# Patient Record
Sex: Female | Born: 1975 | Race: Black or African American | Hispanic: No | Marital: Single | Smoking: Current every day smoker
Health system: Southern US, Community
[De-identification: ages and names within clinical notes are randomized; demographics above are authoritative.]

## PROBLEM LIST (undated history)

## (undated) DIAGNOSIS — M1990 Unspecified osteoarthritis, unspecified site: Secondary | ICD-10-CM

## (undated) DIAGNOSIS — M549 Dorsalgia, unspecified: Secondary | ICD-10-CM

## (undated) DIAGNOSIS — I10 Essential (primary) hypertension: Secondary | ICD-10-CM

## (undated) DIAGNOSIS — M25559 Pain in unspecified hip: Secondary | ICD-10-CM

## (undated) DIAGNOSIS — J45909 Unspecified asthma, uncomplicated: Secondary | ICD-10-CM

## (undated) DIAGNOSIS — G8929 Other chronic pain: Secondary | ICD-10-CM

## (undated) HISTORY — PX: TUBAL LIGATION: SHX77

## (undated) HISTORY — PX: TONSILLECTOMY: SUR1361

---

## 2002-07-28 ENCOUNTER — Encounter: Payer: Self-pay | Admitting: Emergency Medicine

## 2002-07-28 ENCOUNTER — Emergency Department (HOSPITAL_COMMUNITY): Admission: EM | Admit: 2002-07-28 | Discharge: 2002-07-28 | Payer: Self-pay | Admitting: Emergency Medicine

## 2003-01-28 ENCOUNTER — Emergency Department (HOSPITAL_COMMUNITY): Admission: EM | Admit: 2003-01-28 | Discharge: 2003-01-28 | Payer: Self-pay | Admitting: *Deleted

## 2003-01-28 ENCOUNTER — Encounter: Payer: Self-pay | Admitting: *Deleted

## 2003-02-15 ENCOUNTER — Emergency Department (HOSPITAL_COMMUNITY): Admission: EM | Admit: 2003-02-15 | Discharge: 2003-02-15 | Payer: Self-pay | Admitting: *Deleted

## 2003-02-15 ENCOUNTER — Encounter: Payer: Self-pay | Admitting: *Deleted

## 2003-02-19 ENCOUNTER — Emergency Department (HOSPITAL_COMMUNITY): Admission: EM | Admit: 2003-02-19 | Discharge: 2003-02-20 | Payer: Self-pay | Admitting: *Deleted

## 2003-04-08 ENCOUNTER — Emergency Department (HOSPITAL_COMMUNITY): Admission: EM | Admit: 2003-04-08 | Discharge: 2003-04-08 | Payer: Self-pay | Admitting: Emergency Medicine

## 2003-04-14 ENCOUNTER — Emergency Department (HOSPITAL_COMMUNITY): Admission: EM | Admit: 2003-04-14 | Discharge: 2003-04-14 | Payer: Self-pay | Admitting: Emergency Medicine

## 2003-04-23 ENCOUNTER — Emergency Department (HOSPITAL_COMMUNITY): Admission: EM | Admit: 2003-04-23 | Discharge: 2003-04-23 | Payer: Self-pay | Admitting: *Deleted

## 2003-06-21 ENCOUNTER — Emergency Department (HOSPITAL_COMMUNITY): Admission: EM | Admit: 2003-06-21 | Discharge: 2003-06-21 | Payer: Self-pay | Admitting: Emergency Medicine

## 2003-06-25 ENCOUNTER — Ambulatory Visit (HOSPITAL_COMMUNITY): Admission: RE | Admit: 2003-06-25 | Discharge: 2003-06-25 | Payer: Self-pay | Admitting: Family Medicine

## 2003-09-14 ENCOUNTER — Emergency Department (HOSPITAL_COMMUNITY): Admission: EM | Admit: 2003-09-14 | Discharge: 2003-09-14 | Payer: Self-pay | Admitting: Emergency Medicine

## 2003-09-16 ENCOUNTER — Emergency Department (HOSPITAL_COMMUNITY): Admission: EM | Admit: 2003-09-16 | Discharge: 2003-09-16 | Payer: Self-pay | Admitting: Emergency Medicine

## 2003-10-04 ENCOUNTER — Emergency Department (HOSPITAL_COMMUNITY): Admission: EM | Admit: 2003-10-04 | Discharge: 2003-10-04 | Payer: Self-pay | Admitting: Emergency Medicine

## 2003-11-27 ENCOUNTER — Emergency Department (HOSPITAL_COMMUNITY): Admission: EM | Admit: 2003-11-27 | Discharge: 2003-11-27 | Payer: Self-pay | Admitting: Emergency Medicine

## 2004-01-26 ENCOUNTER — Emergency Department (HOSPITAL_COMMUNITY): Admission: EM | Admit: 2004-01-26 | Discharge: 2004-01-27 | Payer: Self-pay | Admitting: Emergency Medicine

## 2004-03-17 ENCOUNTER — Emergency Department (HOSPITAL_COMMUNITY): Admission: EM | Admit: 2004-03-17 | Discharge: 2004-03-17 | Payer: Self-pay | Admitting: Emergency Medicine

## 2004-06-28 ENCOUNTER — Emergency Department (HOSPITAL_COMMUNITY): Admission: EM | Admit: 2004-06-28 | Discharge: 2004-06-29 | Payer: Self-pay | Admitting: *Deleted

## 2004-06-29 ENCOUNTER — Ambulatory Visit (HOSPITAL_COMMUNITY): Admission: RE | Admit: 2004-06-29 | Discharge: 2004-06-29 | Payer: Self-pay | Admitting: *Deleted

## 2004-07-03 ENCOUNTER — Emergency Department (HOSPITAL_COMMUNITY): Admission: EM | Admit: 2004-07-03 | Discharge: 2004-07-03 | Payer: Self-pay | Admitting: Emergency Medicine

## 2004-07-09 ENCOUNTER — Emergency Department (HOSPITAL_COMMUNITY): Admission: EM | Admit: 2004-07-09 | Discharge: 2004-07-10 | Payer: Self-pay | Admitting: *Deleted

## 2004-08-27 ENCOUNTER — Emergency Department (HOSPITAL_COMMUNITY): Admission: EM | Admit: 2004-08-27 | Discharge: 2004-08-27 | Payer: Self-pay | Admitting: Emergency Medicine

## 2004-10-09 ENCOUNTER — Emergency Department (HOSPITAL_COMMUNITY): Admission: EM | Admit: 2004-10-09 | Discharge: 2004-10-09 | Payer: Self-pay | Admitting: Emergency Medicine

## 2005-01-11 ENCOUNTER — Emergency Department (HOSPITAL_COMMUNITY): Admission: EM | Admit: 2005-01-11 | Discharge: 2005-01-11 | Payer: Self-pay | Admitting: Emergency Medicine

## 2005-01-30 ENCOUNTER — Emergency Department (HOSPITAL_COMMUNITY): Admission: EM | Admit: 2005-01-30 | Discharge: 2005-01-30 | Payer: Self-pay | Admitting: Emergency Medicine

## 2005-03-13 ENCOUNTER — Emergency Department (HOSPITAL_COMMUNITY): Admission: EM | Admit: 2005-03-13 | Discharge: 2005-03-14 | Payer: Self-pay | Admitting: *Deleted

## 2005-05-24 ENCOUNTER — Emergency Department (HOSPITAL_COMMUNITY): Admission: EM | Admit: 2005-05-24 | Discharge: 2005-05-24 | Payer: Self-pay | Admitting: Emergency Medicine

## 2005-06-05 ENCOUNTER — Emergency Department (HOSPITAL_COMMUNITY): Admission: EM | Admit: 2005-06-05 | Discharge: 2005-06-05 | Payer: Self-pay | Admitting: Emergency Medicine

## 2005-06-28 IMAGING — CR DG CHEST 2V
2 series · 2 of 2 positions shown · non-contrast
Comparison: 01/30/05.

CLINICAL DATA: Pain under brain, cough. 
 PA AND LATERAL CHEST ? 2 VIEW, 03/14/05:

[view not recorded (1 of 2)]
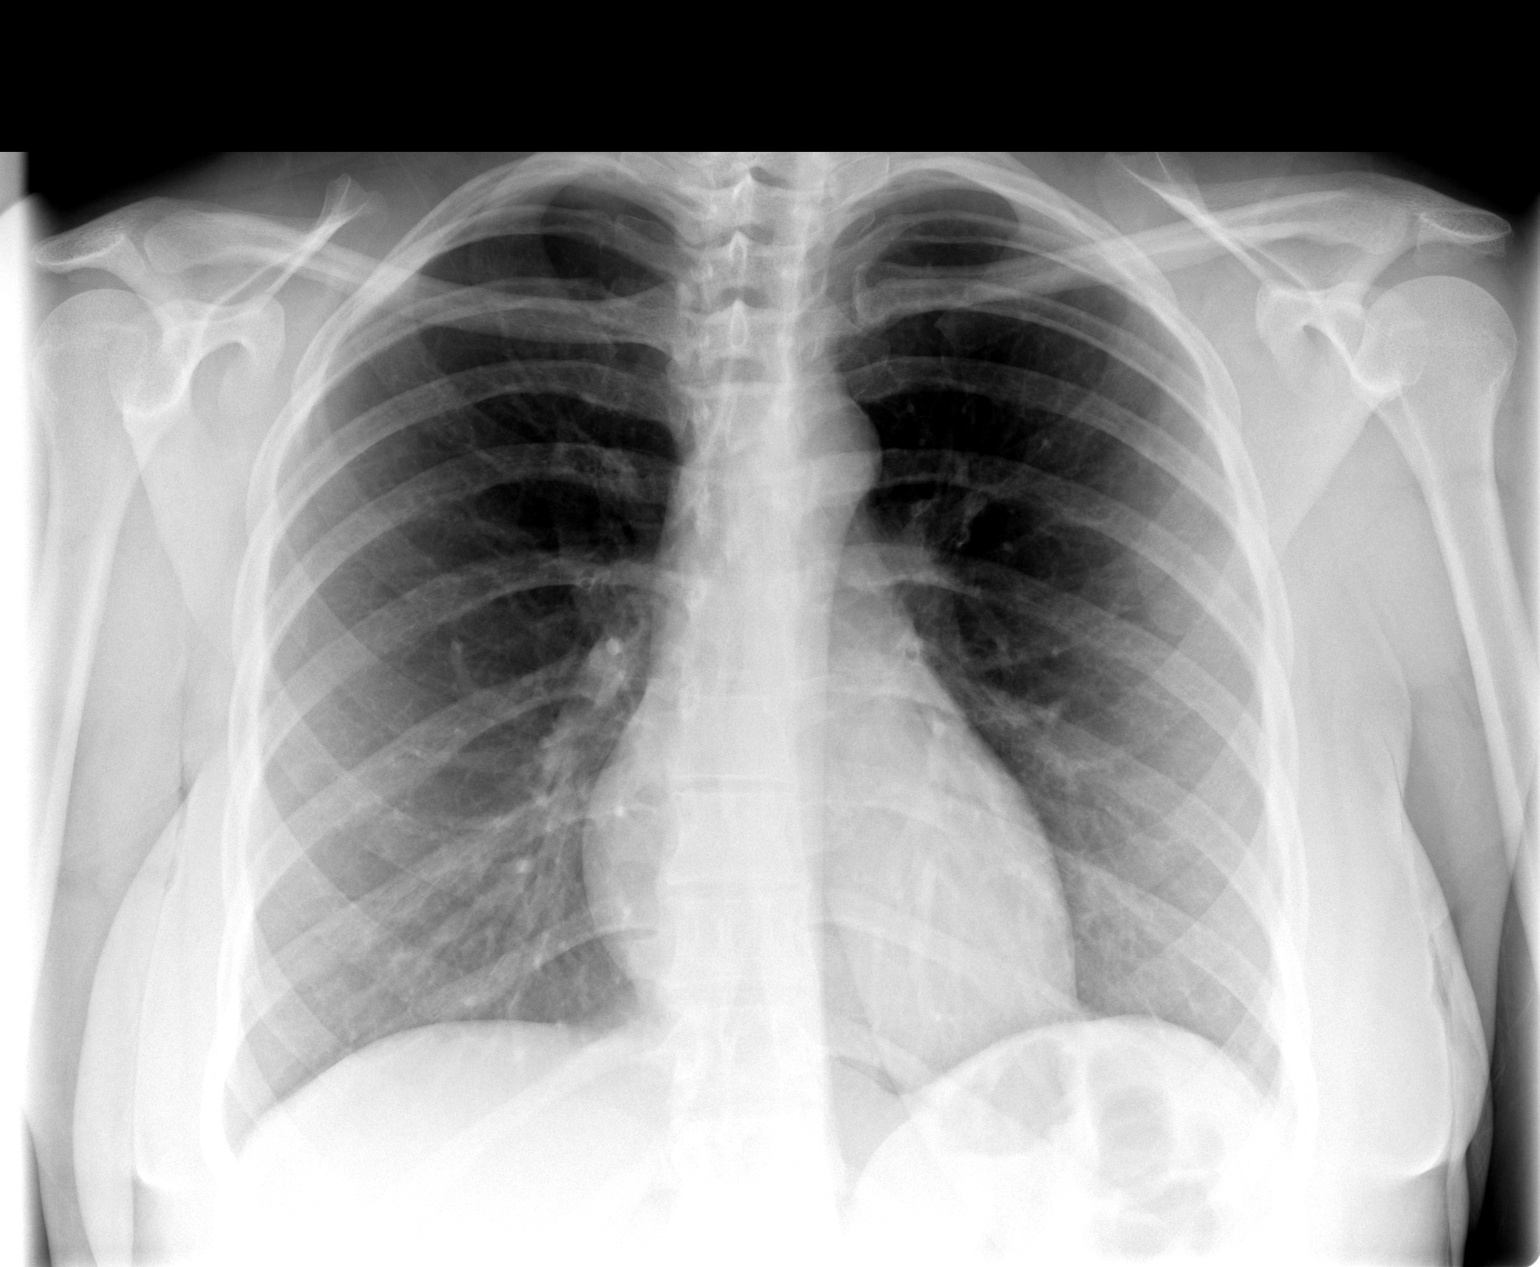

[view not recorded (2 of 2)]
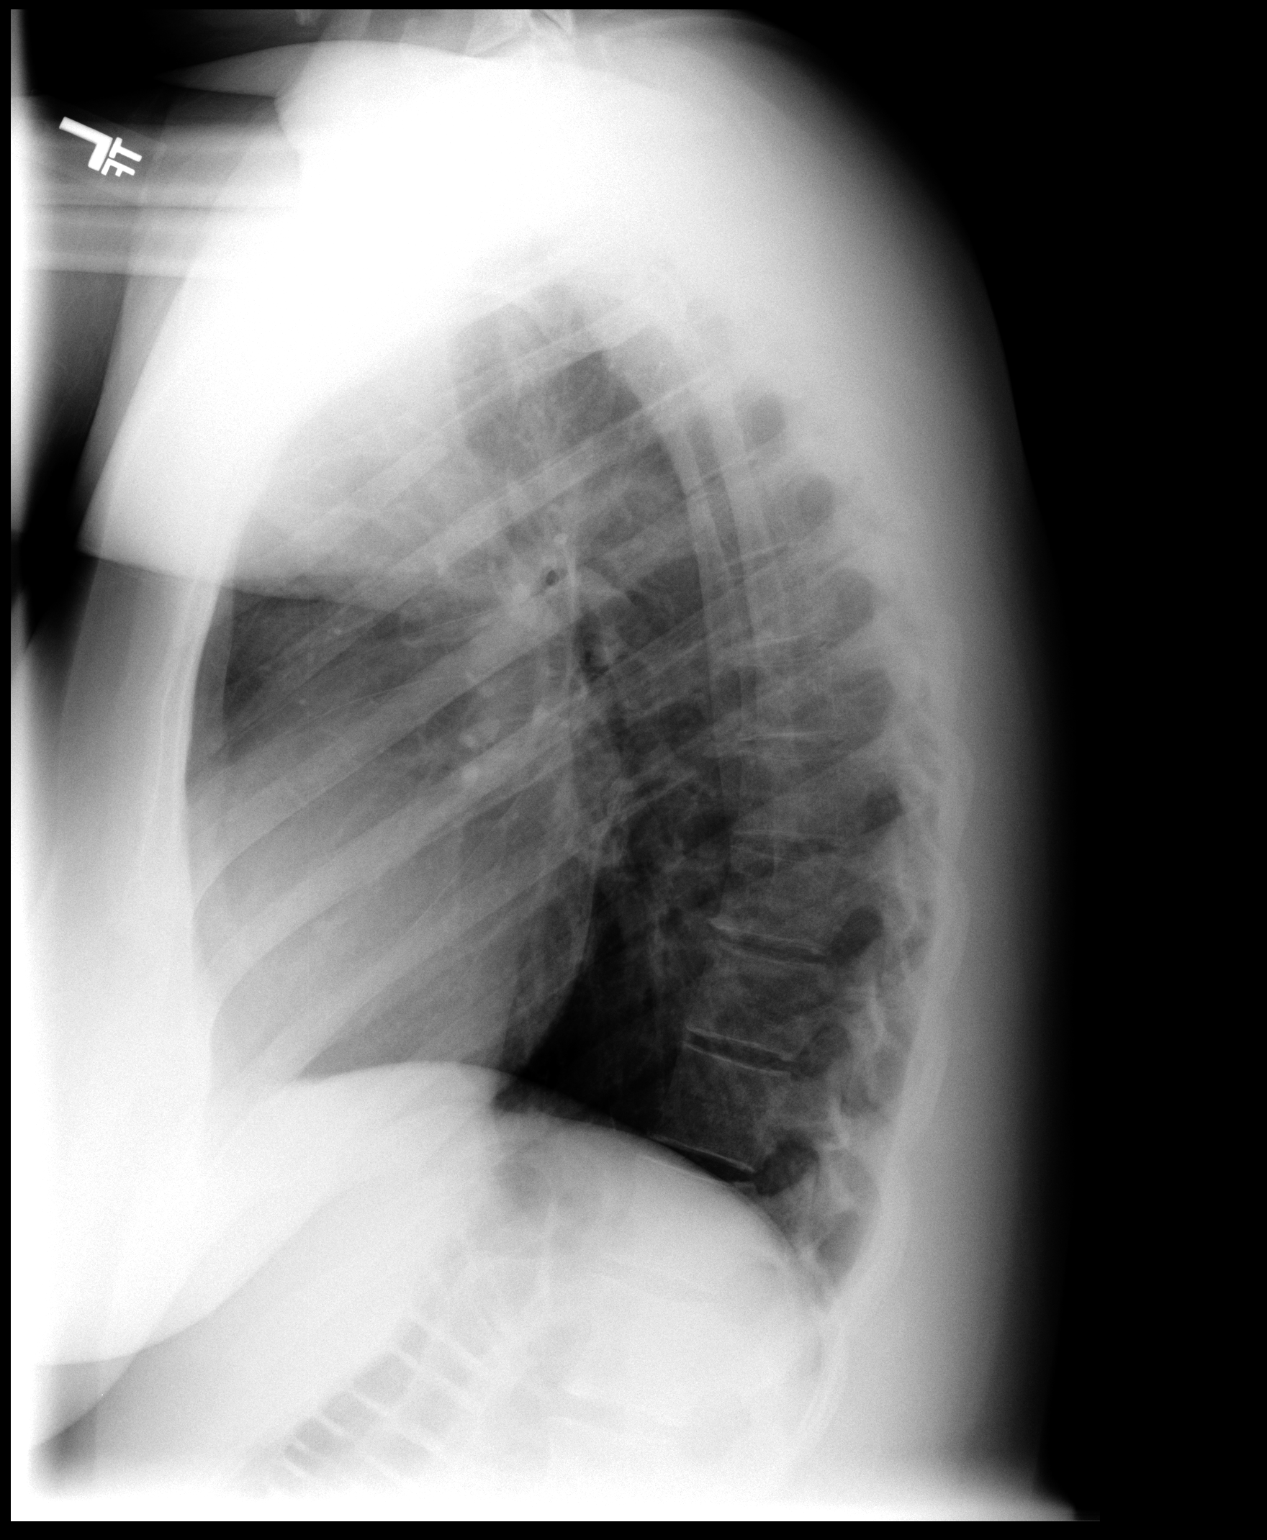

[2 of 2 positions shown; findings below may reference images not displayed]

FINDINGS: The lungs are clear.  Heart size is normal.  No pleural effusion.  No focal bony abnormality.
IMPRESSION: Normal chest.

## 2005-07-28 ENCOUNTER — Emergency Department (HOSPITAL_COMMUNITY): Admission: EM | Admit: 2005-07-28 | Discharge: 2005-07-28 | Payer: Self-pay | Admitting: Emergency Medicine

## 2007-09-29 ENCOUNTER — Emergency Department (HOSPITAL_COMMUNITY): Admission: EM | Admit: 2007-09-29 | Discharge: 2007-09-29 | Payer: Self-pay | Admitting: Emergency Medicine

## 2011-08-23 LAB — WET PREP, GENITAL: Clue Cells Wet Prep HPF POC: NEGATIVE — AB

## 2011-08-23 LAB — CBC
Hemoglobin: 12.3
MCHC: 34.4
MCV: 92.9
WBC: 7.8

## 2011-08-23 LAB — BASIC METABOLIC PANEL
CO2: 25
Calcium: 8.8
GFR calc Af Amer: >60
GFR calc non Af Amer: >60
Glucose, Bld: 97
Potassium: 3.8
Sodium: 137

## 2011-08-23 LAB — URINALYSIS, ROUTINE W REFLEX MICROSCOPIC
Glucose, UA: NEGATIVE
Hgb urine dipstick: NEGATIVE
Protein, ur: NEGATIVE

## 2011-08-23 LAB — PREGNANCY, URINE: Preg Test, Ur: NEGATIVE

## 2011-08-23 LAB — DIFFERENTIAL
Basophils Absolute: 0.1
Basophils Relative: 1
Eosinophils Absolute: 0.2
Lymphs Abs: 3.3
Monocytes Absolute: 0.8
Monocytes Relative: 10

## 2012-07-30 ENCOUNTER — Emergency Department (HOSPITAL_COMMUNITY)
Admission: EM | Admit: 2012-07-30 | Discharge: 2012-07-30 | Disposition: A | Discharge status: Home / Self Care | Payer: Self-pay | Attending: Emergency Medicine | Admitting: Emergency Medicine

## 2012-07-30 ENCOUNTER — Encounter (HOSPITAL_COMMUNITY): Payer: Self-pay | Admitting: *Deleted

## 2012-07-30 ENCOUNTER — Emergency Department (HOSPITAL_COMMUNITY): Payer: Self-pay

## 2012-07-30 DIAGNOSIS — Z720 Tobacco use: Secondary | ICD-10-CM

## 2012-07-30 DIAGNOSIS — J209 Acute bronchitis, unspecified: Secondary | ICD-10-CM | POA: Insufficient documentation

## 2012-07-30 DIAGNOSIS — F17200 Nicotine dependence, unspecified, uncomplicated: Secondary | ICD-10-CM | POA: Insufficient documentation

## 2012-07-30 MED ORDER — ALBUTEROL SULFATE (5 MG/ML) 0.5% IN NEBU
5.0000 mg | INHALATION_SOLUTION | Freq: Once | RESPIRATORY_TRACT | Status: AC
  Administered 2012-07-30: 5 mg via RESPIRATORY_TRACT
  Filled 2012-07-30: qty 0.5

## 2012-07-30 MED ORDER — GUAIFENESIN 100 MG/5ML PO LIQD: 100.0000 mg | ORAL | Status: AC | PRN

## 2012-07-30 MED ORDER — IPRATROPIUM BROMIDE 0.02 % IN SOLN
0.5000 mg | Freq: Once | RESPIRATORY_TRACT | Status: AC
  Administered 2012-07-30: 0.5 mg via RESPIRATORY_TRACT
  Filled 2012-07-30: qty 5

## 2012-07-30 NOTE — ED Notes (Signed)
The pt has had a cough for 2 days and she has a cut to her rt index finger ??????

## 2012-08-14 NOTE — ED Provider Notes (Signed)
History     CSN: [id number]  Arrival date & time 07/30/12  0016   First MD Initiated Contact with Patient 07/30/12 0345      Chief Complaint  Patient presents with  . Cough    (Consider location/radiation/quality/duration/timing/severity/associated sxs/prior treatment) HPI Please note that this is a late entry. This patient was seen and examined by me on the day of her presentation to the emergency department, 07/30/2012.  The patient is a 36 year old woman with a one pack per day smoking habit who has history of chronic bronchitis. She presents with complaints of productive cough. Her symptoms began a couple days ago. She's been using her albuterol inhaler and nebulized treatments as prescribed. However, her cough persists. The patient continues to smoke. She denies fever and shortness of breath. She has not experienced chest pain. She has not been seen by her primary care provider. She denies exacerbating or relieving factors.  Patient says her by mouth intake has been normal.  PMH: chronic  bronchitis  History reviewed. No pertinent past surgical history.  No family history on file.  History  Substance Use Topics  . Smoking status: Current Every Day Smoker  . Smokeless tobacco: Not on file  . Alcohol Use: No    OB History    Grav Para Term Preterm Abortions TAB SAB Ect Mult Living                  Review of Systems Gen: no weight loss, fevers, chills, night sweats Eyes: no discharge or drainage, no occular pain or visual changes Nose: no epistaxis or rhinorrhea Mouth: no dental pain, no sore throat Neck: no neck pain Lungs: As per history of present illness, otherwise negative CV: no chest pain, palpitations, dependent edema or orthopnea Abd: no abdominal pain, nausea, vomiting GU: no dysuria or gross hematuria MSK: no myalgias or arthralgias Neuro: no headache, no focal neurologic deficits Skin: no rash Psyche: negative.  Allergies  Review of patient's  allergies indicates not on file.  Home Medications   Current Outpatient Rx  Name Route Sig Dispense Refill  . ALBUTEROL SULFATE HFA 108 (90 BASE) MCG/ACT IN AERS Inhalation Inhale 2 puffs into the lungs every 6 (six) hours as needed. For shortness of breath    . ALBUTEROL SULFATE (2.5 MG/3ML) 0.083% IN NEBU Nebulization Take 2.5 mg by nebulization every 6 (six) hours as needed. For shortness of breath    . LISINOPRIL-HYDROCHLOROTHIAZIDE 20-12.5 MG PO TABS Oral Take 1 tablet by mouth daily.      BP 140/94  Pulse 61  Temp 98.5 F (36.9 C) (Oral)  Resp 17  SpO2 98%  LMP 07/15/2012  Physical Exam Gen: well developed and well nourished appearing Head: NCAT Eyes: PERL, EOMI Nose: no epistaixis or rhinorrhea Mouth/throat: mucosa is moist and pink Neck: supple, no stridor Lungs: CTA B, no wheezing, rhonchi or rales CV: RRR, no murmur, ext appear well perfused.  Abd: soft, notender, nondistended Back: no ttp, no cva ttp Skin: no rashese, wnl Neuro: CN ii-xii grossly intact, no focal deficits Psyche; normal affect,  calm and cooperative.   ED Course  Procedures (including critical care time)  Labs Reviewed - No data to display No results found.   1. Acute bronchitis   2. Tobacco abuse       MDM  Patient with acute excacerbation of chronic bronchitis. Patient counseled to stop smoking. She appears comfortable with normal VS and exam. She is to continue with Albuterol and we will  prescribe guafeniscen for symptomatic management. Pt to f/u with her PCP. Counseled to please return to the ED for any red flag sx or u rgent concerns.         Brandt Loosen, MD 08/14/12 [phone]

## 2012-10-08 ENCOUNTER — Encounter (HOSPITAL_COMMUNITY): Payer: Self-pay | Admitting: *Deleted

## 2012-10-08 ENCOUNTER — Emergency Department (HOSPITAL_COMMUNITY)
Admission: EM | Admit: 2012-10-08 | Discharge: 2012-10-08 | Disposition: A | Discharge status: Home / Self Care | Payer: Self-pay | Attending: Emergency Medicine | Admitting: Emergency Medicine

## 2012-10-08 DIAGNOSIS — F17200 Nicotine dependence, unspecified, uncomplicated: Secondary | ICD-10-CM | POA: Insufficient documentation

## 2012-10-08 DIAGNOSIS — Z79899 Other long term (current) drug therapy: Secondary | ICD-10-CM | POA: Insufficient documentation

## 2012-10-08 DIAGNOSIS — I10 Essential (primary) hypertension: Secondary | ICD-10-CM | POA: Insufficient documentation

## 2012-10-08 DIAGNOSIS — M545 Low back pain, unspecified: Secondary | ICD-10-CM | POA: Insufficient documentation

## 2012-10-08 DIAGNOSIS — J45909 Unspecified asthma, uncomplicated: Secondary | ICD-10-CM | POA: Insufficient documentation

## 2012-10-08 DIAGNOSIS — R350 Frequency of micturition: Secondary | ICD-10-CM | POA: Insufficient documentation

## 2012-10-08 DIAGNOSIS — N390 Urinary tract infection, site not specified: Secondary | ICD-10-CM | POA: Insufficient documentation

## 2012-10-08 HISTORY — DX: Unspecified asthma, uncomplicated: J45.909

## 2012-10-08 HISTORY — DX: Essential (primary) hypertension: I10

## 2012-10-08 LAB — URINALYSIS, ROUTINE W REFLEX MICROSCOPIC
Bilirubin Urine: NEGATIVE
Glucose, UA: NEGATIVE mg/dL
Hgb urine dipstick: NEGATIVE
Protein, ur: NEGATIVE mg/dL
Specific Gravity, Urine: 1.021 (ref 1.005–1.030)
Urobilinogen, UA: 0.2 mg/dL (ref 0.0–1.0)
pH: 7.5 (ref 5.0–8.0)

## 2012-10-08 MED ORDER — DIAZEPAM 5 MG PO TABS: 2.5000 mg | ORAL_TABLET | Freq: Two times a day (BID) | ORAL | Status: DC

## 2012-10-08 MED ORDER — KETOROLAC TROMETHAMINE 60 MG/2ML IM SOLN
30.0000 mg | Freq: Once | INTRAMUSCULAR | Status: AC
  Administered 2012-10-08: 30 mg via INTRAMUSCULAR
  Filled 2012-10-08: qty 2

## 2012-10-08 MED ORDER — DIAZEPAM 5 MG PO TABS
5.0000 mg | ORAL_TABLET | Freq: Once | ORAL | Status: AC
  Administered 2012-10-08: 5 mg via ORAL
  Filled 2012-10-08: qty 1

## 2012-10-08 MED ORDER — SULFAMETHOXAZOLE-TRIMETHOPRIM 800-160 MG PO TABS: 1.0000 | ORAL_TABLET | Freq: Two times a day (BID) | ORAL | Status: DC

## 2012-10-08 NOTE — ED Provider Notes (Signed)
History     CSN: [id number]  Arrival date & time 10/08/12  1954   First MD Initiated Contact with Patient 10/08/12 2009      Chief Complaint  Patient presents with  . Back Pain    (Consider location/radiation/quality/duration/timing/severity/associated sxs/prior treatment) HPI History provided by pt.   Pt woke w/ diffuse low back pain 2 days ago; worse on R side.  Non-radiating.  Aggravated by sitting for extended period of time and standing from seated position. No associated fever, abdominal pain, bowel dysfunction, LE weakness/paresthesias.  Has had increased urinary frequency but otherwise no urinary sx.  Denies trauma.  Initially reports that she has never had similar pain but now states that she was treated w/ muscle relaxants for same in the past.  Currently taking tylenol w/out relief.    Past Medical History  Diagnosis Date  . Hypertension   . Asthma     History reviewed. No pertinent past surgical history.  No family history on file.  History  Substance Use Topics  . Smoking status: Current Every Day Smoker  . Smokeless tobacco: Not on file  . Alcohol Use: No    OB History    Grav Para Term Preterm Abortions TAB SAB Ect Mult Living                  Review of Systems  All other systems reviewed and are negative.    Allergies  Review of patient's allergies indicates not on file.  Home Medications   Current Outpatient Rx  Name  Route  Sig  Dispense  Refill  . ALBUTEROL SULFATE HFA 108 (90 BASE) MCG/ACT IN AERS   Inhalation   Inhale 2 puffs into the lungs every 6 (six) hours as needed. For shortness of breath         . ALBUTEROL SULFATE (2.5 MG/3ML) 0.083% IN NEBU   Nebulization   Take 2.5 mg by nebulization every 6 (six) hours as needed. For shortness of breath         . LISINOPRIL-HYDROCHLOROTHIAZIDE 20-12.5 MG PO TABS   Oral   Take 1 tablet by mouth daily.           BP 159/102  Pulse 64  Temp 98.2 F (36.8 C) (Oral)  Resp 18   SpO2 97%  LMP 10/01/2012  Physical Exam  Nursing note and vitals reviewed. Constitutional: She is oriented to person, place, and time. She appears well-developed and well-nourished.  HENT:  Head: Normocephalic and atraumatic.  Eyes:       Normal appearance  Neck: Normal range of motion.  Cardiovascular: Normal rate and regular rhythm.   Pulmonary/Chest: Effort normal and breath sounds normal.  Abdominal: Soft. Bowel sounds are normal. She exhibits no distension. There is no tenderness.  Genitourinary:       No CVA ttp  Musculoskeletal:       Lumbar spine non-tender.  Mild tenderness soft tissue right lumbar region. Full active ROM of LE; mild pain w/ R straight leg raise.  Nml patellar reflexes.  Distal sensation intact.  2+ DP pulses.  Ambulates w/out diffulty.   Neurological: She is alert and oriented to person, place, and time.  Skin: Skin is warm and dry. No rash noted.  Psychiatric: She has a normal mood and affect. Her behavior is normal.    ED Course  Procedures (including critical care time)  Labs Reviewed  URINALYSIS, ROUTINE W REFLEX MICROSCOPIC - Abnormal; Notable for the following:    APPearance  HAZY (*)     Leukocytes, UA SMALL (*)     All other components within normal limits  URINE MICROSCOPIC-ADD ON - Abnormal; Notable for the following:    Squamous Epithelial / LPF FEW (*)     Bacteria, UA MANY (*)     All other components within normal limits  URINE CULTURE   No results found.   1. Low back pain   2. Urinary tract infection       MDM  36yo M presents w/ non-traumatic low back pain.  Has had similar pain in the past.  Suspect muscle strain; no indication for emergent imaging today.  Pt seems reasonable.  IM toradol and po valium ordered for pain and U/A d/t c/o recent increased urinary frequency.  8:30 PM   U/A positive for infection, which is likely incidental.  Doubt pyelo; afebrile, well-appearing, no vomiting, no CVA ttp.  Pt reports that pain has  improved and she is ambulatory.  D/c'd home w/ valium and recommended NSAID, rest, heat or ice.  Return precautions discussed.         Otilio Miu, [place] 10/08/12 [phone]

## 2012-10-08 NOTE — ED Notes (Signed)
Back pain for 2 days no nv  .  No known injury.  Chronic back pain.  lmp last sunday

## 2012-10-09 LAB — URINE CULTURE: Colony Count: >=100000

## 2012-10-10 NOTE — ED Provider Notes (Signed)
Medical screening examination/treatment/procedure(s) were performed by non-physician practitioner and as supervising physician I was immediately available for consultation/collaboration.  Tanijah Morais R. Kynlee Koenigsberg, MD 10/10/12 0355 

## 2014-09-01 ENCOUNTER — Emergency Department (HOSPITAL_COMMUNITY)
Admission: EM | Admit: 2014-09-01 | Discharge: 2014-09-01 | Disposition: A | Discharge status: Home / Self Care | Payer: Medicaid Other | Attending: Emergency Medicine | Admitting: Emergency Medicine

## 2014-09-01 ENCOUNTER — Encounter (HOSPITAL_COMMUNITY): Payer: Self-pay | Admitting: Emergency Medicine

## 2014-09-01 ENCOUNTER — Emergency Department (HOSPITAL_COMMUNITY): Payer: Medicaid Other

## 2014-09-01 DIAGNOSIS — Z720 Tobacco use: Secondary | ICD-10-CM | POA: Insufficient documentation

## 2014-09-01 DIAGNOSIS — I10 Essential (primary) hypertension: Secondary | ICD-10-CM | POA: Diagnosis not present

## 2014-09-01 DIAGNOSIS — Z79899 Other long term (current) drug therapy: Secondary | ICD-10-CM | POA: Diagnosis not present

## 2014-09-01 DIAGNOSIS — J45909 Unspecified asthma, uncomplicated: Secondary | ICD-10-CM | POA: Diagnosis not present

## 2014-09-01 DIAGNOSIS — M1611 Unilateral primary osteoarthritis, right hip: Secondary | ICD-10-CM

## 2014-09-01 DIAGNOSIS — M25551 Pain in right hip: Secondary | ICD-10-CM | POA: Diagnosis present

## 2014-09-01 MED ORDER — KETOROLAC TROMETHAMINE 10 MG PO TABS
10.0000 mg | ORAL_TABLET | Freq: Once | ORAL | Status: AC
  Administered 2014-09-01: 10 mg via ORAL
  Filled 2014-09-01: qty 1

## 2014-09-01 MED ORDER — ACETAMINOPHEN-CODEINE #3 300-30 MG PO TABS: 1.0000 | ORAL_TABLET | Freq: Four times a day (QID) | ORAL | Status: DC | PRN

## 2014-09-01 MED ORDER — PREDNISONE 50 MG PO TABS
60.0000 mg | ORAL_TABLET | Freq: Once | ORAL | Status: AC
Start: 2014-09-01 — End: 2014-09-01
  Administered 2014-09-01: 60 mg via ORAL
  Filled 2014-09-01 (×2): qty 1

## 2014-09-01 MED ORDER — DICLOFENAC SODIUM 75 MG PO TBEC: 75.0000 mg | DELAYED_RELEASE_TABLET | Freq: Two times a day (BID) | ORAL | Status: DC

## 2014-09-01 MED ORDER — DEXAMETHASONE 4 MG PO TABS: 4.0000 mg | ORAL_TABLET | Freq: Two times a day (BID) | ORAL | Status: DC

## 2014-09-01 NOTE — Discharge Instructions (Signed)
Your x-ray is consistent with arthritis involving the right hip. Please see your primary physician for additional evaluation and management. Please use the diclofenac and Decadron daily with food. Tylenol codeine may cause drowsiness, please use with caution. Arthritis, Nonspecific Arthritis is pain, redness, warmth, or puffiness (inflammation) of a joint. The joint may be stiff or hurt when you move it. One or more joints may be affected. There are many types of arthritis. Your doctor may not know what type you have right away. The most common cause of arthritis is wear and tear on the joint (osteoarthritis). HOME CARE   Only take medicine as told by your doctor.  Rest the joint as much as possible.  Raise (elevate) your joint if it is puffy.  Use crutches if the painful joint is in your leg.  Drink enough fluids to keep your pee (urine) clear or pale yellow.  Follow your doctor's diet instructions.  Use cold packs for very bad joint pain for 10 to 15 minutes every hour. Ask your doctor if it is okay for you to use hot packs.  Exercise as told by your doctor.  Take a warm shower if you have stiffness in the morning.  Move your sore joints throughout the day. GET HELP RIGHT AWAY IF:   You have a fever.  You have very bad joint pain, puffiness, or redness.  You have many joints that are painful and puffy.  You are not getting better with treatment.  You have very bad back pain or leg weakness.  You cannot control when you poop (bowel movement) or pee (urinate).  You do not feel better in 24 hours or are getting worse.  You are having side effects from your medicine. MAKE SURE YOU:   Understand these instructions.  Will watch your condition.  Will get help right away if you are not doing well or get worse. Document Released: 01/25/2010 Document Revised: 05/01/2012 Document Reviewed: 01/25/2010 [facility] Patient Information 2015 [place], [place]. This information is not  intended to replace advice given to you by your health care provider. Make sure you discuss any questions you have with your health care provider.

## 2014-09-01 NOTE — ED Notes (Addendum)
Patient complaining of right hip pain x 1 week. Denies injury. States she has previous history of same. Patient ambulatory at triage.

## 2014-09-01 NOTE — ED Provider Notes (Signed)
CSN: [id number]     Arrival date & time 09/01/14  [id number] History   First MD Initiated Contact with Patient 09/01/14 [phone]     Chief Complaint  Patient presents with  . Hip Pain     (Consider location/radiation/quality/duration/timing/severity/associated sxs/prior Treatment) HPI Comments: Right hip pain with movement and standing.  Patient is a 38 y.o. female presenting with hip pain. The history is provided by the patient.  Hip Pain This is a new problem. The current episode started 1 to 4 weeks ago. The problem occurs intermittently. The problem has been gradually worsening. Associated symptoms include arthralgias. Pertinent negatives include no abdominal pain, chest pain, coughing or neck pain. The symptoms are aggravated by standing and walking. She has tried acetaminophen for the symptoms. The treatment provided no relief.    Past Medical History  Diagnosis Date  . Hypertension   . Asthma    Past Surgical History  Procedure Laterality Date  . Tonsillectomy    . Tubal ligation     History reviewed. No pertinent family history. History  Substance Use Topics  . Smoking status: Current Every Day Smoker  . Smokeless tobacco: Not on file  . Alcohol Use: No   OB History   Grav Para Term Preterm Abortions TAB SAB Ect Mult Living                 Review of Systems  Constitutional: Negative for activity change.       All ROS Neg except as noted in HPI  Eyes: Negative for photophobia and discharge.  Respiratory: Negative for cough, shortness of breath and wheezing.   Cardiovascular: Negative for chest pain and palpitations.  Gastrointestinal: Negative for abdominal pain and blood in stool.  Genitourinary: Negative for dysuria, frequency and hematuria.  Musculoskeletal: Positive for arthralgias. Negative for back pain and neck pain.  Skin: Negative.   Neurological: Negative for dizziness, seizures and speech difficulty.  Psychiatric/Behavioral: Negative for hallucinations and  confusion.      Allergies  Review of patient's allergies indicates no known allergies.  Home Medications   Prior to Admission medications   Medication Sig Start Date End Date Taking? Authorizing Provider  albuterol (PROVENTIL HFA;VENTOLIN HFA) 108 (90 BASE) MCG/ACT inhaler Inhale 2 puffs into the lungs every 6 (six) hours as needed. For shortness of breath   Yes Historical Provider, MD  albuterol (PROVENTIL) (2.5 MG/3ML) 0.083% nebulizer solution Take 2.5 mg by nebulization every 6 (six) hours as needed. For shortness of breath   Yes Historical Provider, MD  cyclobenzaprine (FLEXERIL) 10 MG tablet Take 10 mg by mouth at bedtime.   Yes Historical Provider, MD  lisinopril-hydrochlorothiazide (PRINZIDE,ZESTORETIC) 20-12.5 MG per tablet Take 1 tablet by mouth daily.   Yes Historical Provider, MD  zolpidem (AMBIEN) 10 MG tablet Take 10 mg by mouth at bedtime.   Yes Historical Provider, MD   BP 144/105  Pulse 76  Temp(Src) 97.8 F (36.6 C) (Oral)  Resp 16  Ht 5\' 9"  (1.753 m)  Wt 190 lb (86.183 kg)  BMI 28.05 kg/m2  SpO2 95%  LMP 08/26/2014 Physical Exam  Nursing note and vitals reviewed. Constitutional: She is oriented to person, place, and time. She appears well-developed and well-nourished.  Non-toxic appearance.  HENT:  Head: Normocephalic.  Right Ear: Tympanic membrane and external ear normal.  Left Ear: Tympanic membrane and external ear normal.  Eyes: EOM and lids are normal. Pupils are equal, round, and reactive to light.  Neck: Normal range of motion. Neck  supple. Carotid bruit is not present.  Cardiovascular: Normal rate, regular rhythm, normal heart sounds, intact distal pulses and normal pulses.   Pulmonary/Chest: Breath sounds normal. No respiratory distress.  Abdominal: Soft. Bowel sounds are normal. There is no tenderness. There is no guarding.  Musculoskeletal:       Right hip: She exhibits decreased range of motion, tenderness and crepitus. She exhibits normal  strength, no bony tenderness, no swelling and no deformity.  Lymphadenopathy:       Head (right side): No submandibular adenopathy present.       Head (left side): No submandibular adenopathy present.    She has no cervical adenopathy.  Neurological: She is alert and oriented to person, place, and time. She has normal strength. No cranial nerve deficit or sensory deficit.  Skin: Skin is warm and dry.  Psychiatric: She has a normal mood and affect. Her speech is normal.    ED Course  Procedures (including critical care time) Labs Review Labs Reviewed - No data to display  Imaging Review No results found.   EKG Interpretation None      MDM  Xray of the pelvis and the right hip reveal loss of joint space with subchondroa sclerrosis and cyst formation. No fx or dislocation. No neurovascular deficits noted.  Pt advised of xray and exam findings. Rx for decadron and diclofenac and tylenol codeine given to the patient. Pt to follow up with orthopedics concerning hip.   Final diagnoses:  Primary osteoarthritis of right hip    **I have reviewed nursing notes, vital signs, and all appropriate lab and imaging results for this patient.Kathie Dike*    Sachin Ferencz M Katleen Carraway, PA-C 09/01/14 2115

## 2014-09-02 NOTE — ED Provider Notes (Signed)
Medical screening examination/treatment/procedure(s) were performed by non-physician practitioner and as supervising physician I was immediately available for consultation/collaboration.   EKG Interpretation None      Devoria AlbeIva Marissa Weaver, MD, Armando GangFACEP   Ward GivensIva L Albaraa Swingle, MD 09/02/14 [phone]

## 2015-01-02 ENCOUNTER — Encounter (HOSPITAL_COMMUNITY): Payer: Self-pay | Admitting: Emergency Medicine

## 2015-01-02 ENCOUNTER — Emergency Department (HOSPITAL_COMMUNITY)
Admission: EM | Admit: 2015-01-02 | Discharge: 2015-01-02 | Disposition: A | Discharge status: Home / Self Care | Payer: Medicaid Other | Attending: Emergency Medicine | Admitting: Emergency Medicine

## 2015-01-02 DIAGNOSIS — Z791 Long term (current) use of non-steroidal anti-inflammatories (NSAID): Secondary | ICD-10-CM | POA: Diagnosis not present

## 2015-01-02 DIAGNOSIS — Z79899 Other long term (current) drug therapy: Secondary | ICD-10-CM | POA: Diagnosis not present

## 2015-01-02 DIAGNOSIS — J45909 Unspecified asthma, uncomplicated: Secondary | ICD-10-CM | POA: Insufficient documentation

## 2015-01-02 DIAGNOSIS — I10 Essential (primary) hypertension: Secondary | ICD-10-CM | POA: Insufficient documentation

## 2015-01-02 DIAGNOSIS — M545 Low back pain: Secondary | ICD-10-CM | POA: Diagnosis not present

## 2015-01-02 DIAGNOSIS — Z3202 Encounter for pregnancy test, result negative: Secondary | ICD-10-CM | POA: Insufficient documentation

## 2015-01-02 DIAGNOSIS — M25551 Pain in right hip: Secondary | ICD-10-CM | POA: Diagnosis present

## 2015-01-02 LAB — POC URINE PREG, ED: PREG TEST UR: NEGATIVE

## 2015-01-02 MED ORDER — HYDROCODONE-ACETAMINOPHEN 5-325 MG PO TABS
1.0000 | ORAL_TABLET | Freq: Once | ORAL | Status: AC
  Administered 2015-01-02: 1 via ORAL
  Filled 2015-01-02: qty 1

## 2015-01-02 MED ORDER — DIAZEPAM 5 MG PO TABS
5.0000 mg | ORAL_TABLET | Freq: Once | ORAL | Status: AC
  Administered 2015-01-02: 5 mg via ORAL
  Filled 2015-01-02: qty 1

## 2015-01-02 MED ORDER — HYDROCODONE-ACETAMINOPHEN 5-325 MG PO TABS: 1.0000 | ORAL_TABLET | Freq: Four times a day (QID) | ORAL | Status: DC | PRN

## 2015-01-02 MED ORDER — DIAZEPAM 5 MG PO TABS: 5.0000 mg | ORAL_TABLET | Freq: Two times a day (BID) | ORAL | Status: AC

## 2015-01-02 MED ORDER — IBUPROFEN 800 MG PO TABS
800.0000 mg | ORAL_TABLET | Freq: Once | ORAL | Status: AC
  Administered 2015-01-02: 800 mg via ORAL
  Filled 2015-01-02: qty 1

## 2015-01-02 MED ORDER — IBUPROFEN 800 MG PO TABS
800.0000 mg | ORAL_TABLET | Freq: Three times a day (TID) | ORAL | Status: AC
Start: 2015-01-02 — End: 2015-01-04

## 2015-01-02 NOTE — Discharge Instructions (Signed)
As discussed, your evaluation today has been largely reassuring.  But, it is important that you monitor your condition carefully, and do not hesitate to return to the ED if you develop new, or concerning changes in your condition. ? ?Otherwise, please follow-up with your physician for appropriate ongoing care. ? ?

## 2015-01-02 NOTE — ED Notes (Signed)
Patient with no complaints at this time. Respirations even and unlabored. Skin warm/dry. Discharge instructions reviewed with patient at this time. Patient given opportunity to voice concerns/ask questions. Patient discharged at this time and left Emergency Department with steady gait.   

## 2015-01-02 NOTE — ED Provider Notes (Signed)
CSN: [id number]     Arrival date & time 01/02/15  0912 History  This chart was scribed for Gerhard Munch, MD by Tonye Royalty, ED Scribe. This patient was seen in room APA04/APA04 and the patient's care was started at 9:30 AM.    Chief Complaint  Patient presents with  . Hip Pain   The history is provided by the patient. No language interpreter was used.    HPI Comments: Mariah Bell is a 39 y.o. female who presents to the Emergency Department complaining of right hip pain with onset 1 week ago and low back pain with onset yesterday. She states she has been having similar episodes of pain for the past year. She states nothing in particular brings on these episodes. She denies new falls, exercise, or activity. She states she has been using Aleve, approximately 1 per day, for pain. She states she has not had physical therapy or seen ortho for her pain previously. 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 She denies health problems besides HTN and asthma. She states she smokes but is trying to stop. She denies alcohol use. She denies fever, vomiting, or abdominal pain.  Smoking Cessation counseling provided  Past Medical History  Diagnosis Date  . Hypertension   . Asthma    Past Surgical History  Procedure Laterality Date  . Tonsillectomy    . Tubal ligation     Family History  Problem Relation Age of Onset  . Diabetes Mother    History  Substance Use Topics  . Smoking status: Current Every Day Smoker -- 0.50 packs/day    Types: Cigarettes  . Smokeless tobacco: Never Used  . Alcohol Use: No   OB History    Gravida Para Term Preterm AB TAB SAB Ectopic Multiple Living   Review of Systems  Constitutional: Negative for fever.       Per HPI, otherwise negative  HENT:       Per HPI, otherwise negative  Respiratory:       Per HPI, otherwise negative  Cardiovascular:       Per HPI, otherwise negative  Gastrointestinal: Negative for  vomiting and abdominal pain.  Endocrine:       Negative aside from HPI  Genitourinary:       Neg aside from HPI   Musculoskeletal: Positive for back pain.       Right hip pain Per HPI, otherwise negative  Skin: Negative.   Neurological: Negative for syncope.      Allergies  Review of patient's allergies indicates no known allergies.  Home Medications   Prior to Admission medications   Medication Sig Start Date End Date Taking? Authorizing Provider  acetaminophen-codeine (TYLENOL #3) 300-30 MG per tablet Take 1-2 tablets by mouth every 6 (six) hours as needed. 09/01/14   Kathie Dike, PA-C  albuterol (PROVENTIL HFA;VENTOLIN HFA) 108 (90 BASE) MCG/ACT inhaler Inhale 2 puffs into the lungs every 6 (six) hours as needed. For shortness of breath    Historical Provider, MD  albuterol (PROVENTIL) (2.5 MG/3ML) 0.083% nebulizer solution Take 2.5 mg by nebulization every 6 (six) hours as needed. For shortness of breath    Historical Provider, MD  cyclobenzaprine (FLEXERIL) 10 MG tablet Take 10 mg by mouth at bedtime.    Historical Provider, MD  dexamethasone (DECADRON) 4 MG tablet Take 1 tablet (4 mg total) by mouth 2 (two) times daily  with a meal. 09/01/14   Kathie DikeHobson M Bryant, PA-C  diclofenac (VOLTAREN) 75 MG EC tablet Take 1 tablet (75 mg total) by mouth 2 (two) times daily. 09/01/14   Kathie DikeHobson M Bryant, PA-C  lisinopril-hydrochlorothiazide (PRINZIDE,ZESTORETIC) 20-12.5 MG per tablet Take 1 tablet by mouth daily.    Historical Provider, MD  zolpidem (AMBIEN) 10 MG tablet Take 10 mg by mouth at bedtime.    Historical Provider, MD   BP 165/97 mmHg  Pulse 73  Temp(Src) 98 F (36.7 C) (Oral)  Resp 14  Ht 5\' 9"  (1.753 m)  Wt 198 lb (89.812 kg)  BMI 29.23 kg/m2  SpO2 100%  LMP 12/22/2014 Physical Exam  Constitutional: She is oriented to person, place, and time. She appears well-developed and well-nourished. No distress.  HENT:  Head: Normocephalic and atraumatic.  Eyes: Conjunctivae and  EOM are normal.  Cardiovascular: Normal rate, regular rhythm and normal heart sounds.   No murmur heard. Pulmonary/Chest: Effort normal and breath sounds normal. No stridor. No respiratory distress. She has no wheezes. She has no rales.  Abdominal: Soft. She exhibits no distension. There is no tenderness.  Musculoskeletal: She exhibits no edema.  Hip adduction more painful than abduction No pain with passive motion  Neurological: She is alert and oriented to person, place, and time. No cranial nerve deficit.  Skin: Skin is warm and dry.  Psychiatric: She has a normal mood and affect.  Nursing note and vitals reviewed.   ED Course  Procedures (including critical care time)  DIAGNOSTIC STUDIES: Oxygen Saturation is 100% on room air, normal by my interpretation.    COORDINATION OF CARE: 9:30 AM Discussed treatment plan with patient at beside, including ice pack, muscle relaxants, pain medication, and physical therapy. The patient agrees with the plan and has no further questions at this time.   I reviewed the patient's chart.  Patient was seen here last about 5 months ago with similar concerns.  She was diagnosed with musculoskeletal pain.   MDM   Final diagnoses:  Hip pain, right  Low back pain without sciatica, unspecified back pain laterality   patient presents with musculoskeletal pain.  No evidence for neurovascular compromise. No history of trauma or fall, and low suspicion for septic arthralgia, or other acute new pathology. Patient was provided referral to orthopedist for consideration of physical therapy, as well as scheduled medication for pain relief over the next few days.   I personally performed the services described in this documentation, which was scribed in my presence. The recorded information has been reviewed and is accurate.     Gerhard Munchobert Lanijah Warzecha, MD 01/02/15 [phone]

## 2015-01-02 NOTE — ED Notes (Signed)
Pt reports onset of R hip pain 4 days ago and low back pain yesterday.

## 2015-02-04 ENCOUNTER — Emergency Department (HOSPITAL_COMMUNITY)
Admission: EM | Admit: 2015-02-04 | Discharge: 2015-02-04 | Disposition: A | Discharge status: Home / Self Care | Payer: Medicaid Other | Attending: Emergency Medicine | Admitting: Emergency Medicine

## 2015-02-04 ENCOUNTER — Encounter (HOSPITAL_COMMUNITY): Payer: Self-pay | Admitting: *Deleted

## 2015-02-04 DIAGNOSIS — Z79899 Other long term (current) drug therapy: Secondary | ICD-10-CM | POA: Insufficient documentation

## 2015-02-04 DIAGNOSIS — J029 Acute pharyngitis, unspecified: Secondary | ICD-10-CM | POA: Diagnosis present

## 2015-02-04 DIAGNOSIS — J069 Acute upper respiratory infection, unspecified: Secondary | ICD-10-CM | POA: Insufficient documentation

## 2015-02-04 DIAGNOSIS — I10 Essential (primary) hypertension: Secondary | ICD-10-CM | POA: Insufficient documentation

## 2015-02-04 DIAGNOSIS — Z720 Tobacco use: Secondary | ICD-10-CM | POA: Diagnosis not present

## 2015-02-04 DIAGNOSIS — J45909 Unspecified asthma, uncomplicated: Secondary | ICD-10-CM | POA: Diagnosis not present

## 2015-02-04 LAB — RAPID STREP SCREEN (MED CTR MEBANE ONLY): Streptococcus, Group A Screen (Direct): NEGATIVE

## 2015-02-04 MED ORDER — CETIRIZINE-PSEUDOEPHEDRINE ER 5-120 MG PO TB12: 1.0000 | ORAL_TABLET | Freq: Two times a day (BID) | ORAL | Status: DC

## 2015-02-04 MED ORDER — PROMETHAZINE-CODEINE 6.25-10 MG/5ML PO SYRP: 5.0000 mL | ORAL_SOLUTION | ORAL | Status: DC | PRN

## 2015-02-04 NOTE — ED Notes (Signed)
Pt states nasal congestion ("I can't breathe through my nose", per pt), sore throat, cough, bilateral ear pain. Symptoms since Monday.

## 2015-02-04 NOTE — Discharge Instructions (Signed)

## 2015-02-04 NOTE — ED Provider Notes (Signed)
CSN: [id number]     Arrival date & time 02/04/15  1110 History   First MD Initiated Contact with Patient 02/04/15 1126     Chief Complaint  Patient presents with  . Sore Throat     (Consider location/radiation/quality/duration/timing/severity/associated sxs/prior Treatment) The history is provided by the patient.   Mariah Bell is a 39 y.o. female presenting with a 2 day history of uri type symptoms which includes nasal congestion with clear rhinorrhea, sore throat, low grade fever and nonproductive cough.  Symptoms due to not include shortness of breath, chest pain,  Nausea, vomiting or diarrhea.  The patient has taken nyquill prior to arrival with no significant improvement in symptoms.   Past Medical History  Diagnosis Date  . Hypertension   . Asthma    Past Surgical History  Procedure Laterality Date  . Tonsillectomy    . Tubal ligation     Family History  Problem Relation Age of Onset  . Diabetes Mother    History  Substance Use Topics  . Smoking status: Current Every Day Smoker -- 0.50 packs/day    Types: Cigarettes  . Smokeless tobacco: Never Used  . Alcohol Use: No   OB History    Gravida Para Term Preterm AB TAB SAB Ectopic Multiple Living   Review of Systems  Constitutional: Negative for fever and chills.  HENT: Positive for congestion, rhinorrhea and sore throat. Negative for ear pain, sinus pressure, trouble swallowing and voice change.   Eyes: Negative for discharge.  Respiratory: Positive for cough. Negative for shortness of breath, wheezing and stridor.   Cardiovascular: Negative for chest pain.  Gastrointestinal: Negative for abdominal pain.  Genitourinary: Negative.       Allergies  Review of patient's allergies indicates no known allergies.  Home Medications   Prior to Admission medications   Medication Sig Start Date End Date Taking? Authorizing Provider  albuterol (PROVENTIL HFA;VENTOLIN HFA) 108 (90 BASE)  MCG/ACT inhaler Inhale 2 puffs into the lungs every 6 (six) hours as needed. For shortness of breath   Yes Historical Provider, MD  albuterol (PROVENTIL) (2.5 MG/3ML) 0.083% nebulizer solution Take 2.5 mg by nebulization every 6 (six) hours as needed. For shortness of breath   Yes Historical Provider, MD  Chlorpheniramine-Pseudoeph (PSEUDEPHEDRINE PLUS PO) Take 2 tablets by mouth daily as needed (cold).   Yes Historical Provider, MD  naproxen sodium (ANAPROX) 220 MG tablet Take 220 mg by mouth 2 (two) times daily as needed (pain).    Yes Historical Provider, MD  Pseudoeph-Doxylamine-DM-APAP (NYQUIL PO) Take 1 capsule by mouth 2 (two) times daily as needed (cold).   Yes Historical Provider, MD  cetirizine-pseudoephedrine (ZYRTEC-D) 5-120 MG per tablet Take 1 tablet by mouth 2 (two) times daily. 02/04/15   Burgess Amor, PA-C  HYDROcodone-acetaminophen (NORCO/VICODIN) 5-325 MG per tablet Take 1 tablet by mouth every 6 (six) hours as needed for severe pain. Patient not taking: Reported on 02/04/2015 01/02/15   Gerhard Munch, MD  lisinopril-hydrochlorothiazide (PRINZIDE,ZESTORETIC) 20-12.5 MG per tablet Take 1 tablet by mouth daily.    Historical Provider, MD  promethazine-codeine (PHENERGAN WITH CODEINE) 6.25-10 MG/5ML syrup Take 5 mLs by mouth every 4 (four) hours as needed for cough (and throat pain). 02/04/15   Burgess Amor, PA-C   BP 160/68 mmHg  Pulse 87  Temp(Src) 98.2 F (36.8 C) (Oral)  Resp 16  Ht  (1.753 m)  Wt 198 lb (  89.812 kg)  BMI 29.23 kg/m2  SpO2 100%  LMP 01/21/2015 Physical Exam  Constitutional: She is oriented to person, place, and time. She appears well-developed and well-nourished.  HENT:  Head: Normocephalic and atraumatic.  Right Ear: Tympanic membrane and ear canal normal.  Left Ear: Tympanic membrane and ear canal normal.  Nose: Mucosal edema and rhinorrhea present.  Mouth/Throat: Uvula is midline and mucous membranes are normal. Posterior oropharyngeal erythema  present. No oropharyngeal exudate, posterior oropharyngeal edema or tonsillar abscesses.  Scattered petechiae on soft palate.  Eyes: Conjunctivae are normal.  Cardiovascular: Normal rate and normal heart sounds.   Pulmonary/Chest: Effort normal. No respiratory distress. She has no wheezes. She has no rales.  Abdominal: Soft. There is no tenderness.  Musculoskeletal: Normal range of motion.  Neurological: She is alert and oriented to person, place, and time.  Skin: Skin is warm and dry. No rash noted.  Psychiatric: She has a normal mood and affect.    ED Course  Procedures (including critical care time) Labs Review Labs Reviewed  RAPID STREP SCREEN  CULTURE, GROUP A STREP    Imaging Review No results found.   EKG Interpretation None      MDM   Final diagnoses:  Acute URI    Viral uri. Strep negative.  Phenergan with codeine cough syrup, zyrtec d. Rest, increased fluid intake.  Prn f/u with pcp if sx persist.    Burgess AmorJulie Andrea Colglazier, PA-C 02/04/15 1300  Margarita Grizzleanielle Ray, MD 02/04/15 1510

## 2015-02-06 LAB — CULTURE, GROUP A STREP: Strep A Culture: NEGATIVE

## 2015-05-25 ENCOUNTER — Encounter (HOSPITAL_COMMUNITY): Payer: Self-pay | Admitting: *Deleted

## 2015-05-25 ENCOUNTER — Emergency Department (HOSPITAL_COMMUNITY)
Admission: EM | Admit: 2015-05-25 | Discharge: 2015-05-25 | Disposition: A | Discharge status: Home / Self Care | Payer: Medicaid Other | Attending: Emergency Medicine | Admitting: Emergency Medicine

## 2015-05-25 DIAGNOSIS — Z720 Tobacco use: Secondary | ICD-10-CM | POA: Insufficient documentation

## 2015-05-25 DIAGNOSIS — Z79899 Other long term (current) drug therapy: Secondary | ICD-10-CM | POA: Diagnosis not present

## 2015-05-25 DIAGNOSIS — J45909 Unspecified asthma, uncomplicated: Secondary | ICD-10-CM | POA: Diagnosis not present

## 2015-05-25 DIAGNOSIS — M25551 Pain in right hip: Secondary | ICD-10-CM | POA: Diagnosis not present

## 2015-05-25 DIAGNOSIS — G8929 Other chronic pain: Secondary | ICD-10-CM | POA: Diagnosis not present

## 2015-05-25 DIAGNOSIS — I10 Essential (primary) hypertension: Secondary | ICD-10-CM | POA: Insufficient documentation

## 2015-05-25 DIAGNOSIS — R109 Unspecified abdominal pain: Secondary | ICD-10-CM | POA: Diagnosis present

## 2015-05-25 DIAGNOSIS — N390 Urinary tract infection, site not specified: Secondary | ICD-10-CM | POA: Insufficient documentation

## 2015-05-25 LAB — URINALYSIS, ROUTINE W REFLEX MICROSCOPIC
BILIRUBIN URINE: NEGATIVE
GLUCOSE, UA: NEGATIVE mg/dL
Ketones, ur: NEGATIVE mg/dL
NITRITE: NEGATIVE
Protein, ur: NEGATIVE mg/dL
SPECIFIC GRAVITY, URINE: 1.025 (ref 1.005–1.030)
Urobilinogen, UA: 0.2 mg/dL (ref 0.0–1.0)
pH: 5.5 (ref 5.0–8.0)

## 2015-05-25 LAB — URINE MICROSCOPIC-ADD ON

## 2015-05-25 MED ORDER — CEPHALEXIN 500 MG PO CAPS: 500.0000 mg | ORAL_CAPSULE | Freq: Four times a day (QID) | ORAL | Status: DC

## 2015-05-25 MED ORDER — HYDROCODONE-ACETAMINOPHEN 5-325 MG PO TABS: ORAL_TABLET | ORAL | Status: DC

## 2015-05-25 MED ORDER — OXYCODONE-ACETAMINOPHEN 5-325 MG PO TABS
2.0000 | ORAL_TABLET | Freq: Once | ORAL | Status: AC
  Administered 2015-05-25: 2 via ORAL
  Filled 2015-05-25: qty 2

## 2015-05-25 MED ORDER — CEPHALEXIN 500 MG PO CAPS
500.0000 mg | ORAL_CAPSULE | Freq: Once | ORAL | Status: AC
  Administered 2015-05-25: 500 mg via ORAL
  Filled 2015-05-25: qty 1

## 2015-05-25 NOTE — Discharge Instructions (Signed)
Arthralgia Arthralgia is joint pain. A joint is a place where two bones meet. Joint pain can happen for many reasons. The joint can be bruised, stiff, infected, or weak from aging. Pain usually goes away after resting and taking medicine for soreness.  HOME CARE  Rest the joint as told by your doctor.  Keep the sore joint raised (elevated) for the first 24 hours.  Put ice on the joint area.  Put ice in a plastic bag.  Place a towel between your skin and the bag.  Leave the ice on for 15-20 minutes, 03-04 times a day.  Wear your splint, casting, elastic bandage, or sling as told by your doctor.  Only take medicine as told by your doctor. Do not take aspirin.  Use crutches as told by your doctor. Do not put weight on the joint until told to by your doctor. GET HELP RIGHT AWAY IF:   You have bruising, puffiness (swelling), or more pain.  Your fingers or toes turn blue or start to lose feeling (numb).  Your medicine does not lessen the pain.  Your pain becomes severe.  You have a temperature by mouth above 102 F (38.9 C), not controlled by medicine.  You cannot move or use the joint. MAKE SURE YOU:   Understand these instructions.  Will watch your condition.  Will get help right away if you are not doing well or get worse. Document Released: 10/19/2009 Document Revised: 01/23/2012 Document Reviewed: 10/19/2009 [facility] Patient Information 2015 [place], [place]. This information is not intended to replace advice given to you by your health care provider. Make sure you discuss any questions you have with your health care provider.  Urinary Tract Infection A urinary tract infection (UTI) can occur any place along the urinary tract. The tract includes the kidneys, ureters, bladder, and urethra. A type of germ called bacteria often causes a UTI. UTIs are often helped with antibiotic medicine.  HOME CARE   If given, take antibiotics as told by your doctor. Finish them even if  you start to feel better.  Drink enough fluids to keep your pee (urine) clear or pale yellow.  Avoid tea, drinks with caffeine, and bubbly (carbonated) drinks.  Pee often. Avoid holding your pee in for a long time.  Pee before and after having sex (intercourse).  Wipe from front to back after you poop (bowel movement) if you are a woman. Use each tissue only once. GET HELP RIGHT AWAY IF:   You have back pain.  You have lower belly (abdominal) pain.  You have chills.  You feel sick to your stomach (nauseous).  You throw up (vomit).  Your burning or discomfort with peeing does not go away.  You have a fever.  Your symptoms are not better in 3 days. MAKE SURE YOU:   Understand these instructions.  Will watch your condition.  Will get help right away if you are not doing well or get worse. Document Released: 04/18/2008 Document Revised: 07/25/2012 Document Reviewed: 05/31/2012 [facility] PLLCExitCare Patient Information 2015 [place], [place]. This information is not intended to replace advice given to you by your health care provider. Make sure you discuss any questions you have with your health care provider.

## 2015-05-25 NOTE — ED Notes (Signed)
Pt verbalized understanding of no driving and to use caution within 4 hours of taking pain meds due to meds cause drowsiness 

## 2015-05-25 NOTE — ED Notes (Signed)
Pt c/o right flank pain that started today; pt denies any other symptoms

## 2015-05-28 LAB — URINE CULTURE

## 2015-05-28 NOTE — ED Provider Notes (Signed)
CSN: [id number]     Arrival date & time 05/25/15  1953 History   First MD Initiated Contact with Patient 05/25/15 2129     Chief Complaint  Patient presents with  . Flank Pain     (Consider location/radiation/quality/duration/timing/severity/associated sxs/prior Treatment) HPI   Mariah Bell is a 39 y.o. female who presents to the Emergency Department complaining of urinary frequency and flank pain for one day.  She reports pain to her side that is worse with movement.  She has not taken any medications for symptoms.  She denies nausea, vomiting, chills, fever, bloody urine and abdominal pain. She also reports chronic pain to her right hip that she contributes to arthritis.  She denies recent injury.     Past Medical History  Diagnosis Date  . Hypertension   . Asthma    Past Surgical History  Procedure Laterality Date  . Tonsillectomy    . Tubal ligation     Family History  Problem Relation Age of Onset  . Diabetes Mother    History  Substance Use Topics  . Smoking status: Current Every Day Smoker -- 0.50 packs/day    Types: Cigarettes  . Smokeless tobacco: Never Used  . Alcohol Use: No   OB History    Gravida Para Term Preterm AB TAB SAB Ectopic Multiple Living   Review of Systems  Constitutional: Negative for fever, chills, activity change and appetite change.  Respiratory: Negative for chest tightness and shortness of breath.   Gastrointestinal: Negative for nausea, vomiting and abdominal pain.  Genitourinary: Positive for dysuria, urgency, frequency and flank pain. Negative for hematuria, decreased urine volume and difficulty urinating.  Musculoskeletal: Negative for back pain.  Skin: Negative for rash.  Neurological: Negative for dizziness, weakness and numbness.  Hematological: Negative for adenopathy.  Psychiatric/Behavioral: Negative for confusion.  All other systems reviewed and are negative.     Allergies  Review of  patient's allergies indicates no known allergies.  Home Medications   Prior to Admission medications   Medication Sig Start Date End Date Taking? Authorizing Provider  albuterol (PROVENTIL HFA;VENTOLIN HFA) 108 (90 BASE) MCG/ACT inhaler Inhale 2 puffs into the lungs every 6 (six) hours as needed. For shortness of breath    Historical Provider, MD  albuterol (PROVENTIL) (2.5 MG/3ML) 0.083% nebulizer solution Take 2.5 mg by nebulization every 6 (six) hours as needed. For shortness of breath    Historical Provider, MD  cephALEXin (KEFLEX) 500 MG capsule Take 1 capsule (500 mg total) by mouth 4 (four) times daily. For 7 days 05/25/15   Tobie Perdue, PA-C  cetirizine-pseudoephedrine (ZYRTEC-D) 5-120 MG per tablet Take 1 tablet by mouth 2 (two) times daily. 02/04/15   Burgess Amor, PA-C  Chlorpheniramine-Pseudoeph (PSEUDEPHEDRINE PLUS PO) Take 2 tablets by mouth daily as needed (cold).    Historical Provider, MD  HYDROcodone-acetaminophen (NORCO/VICODIN) 5-325 MG per tablet Take one tab po q 4-6 hrs prn pain 05/25/15   Chellsea Beckers, PA-C  lisinopril-hydrochlorothiazide (PRINZIDE,ZESTORETIC) 20-12.5 MG per tablet Take 1 tablet by mouth daily.    Historical Provider, MD  naproxen sodium (ANAPROX) 220 MG tablet Take 220 mg by mouth 2 (two) times daily as needed (pain).     Historical Provider, MD  promethazine-codeine (PHENERGAN WITH CODEINE) 6.25-10 MG/5ML syrup Take 5 mLs by mouth every 4 (four) hours as needed for cough (and throat pain). 02/04/15   Burgess Amor, PA-C  Pseudoeph-Doxylamine-DM-APAP (NYQUIL  PO) Take 1 capsule by mouth 2 (two) times daily as needed (cold).    Historical Provider, MD   BP 163/100 mmHg  Pulse 69  Temp(Src) 98.9 F (37.2 C) (Oral)  Resp 16  Ht 5\' 9"  (1.753 m)  Wt 208 lb (94.348 kg)  BMI 30.70 kg/m2  SpO2 100%  LMP 04/27/2015 Physical Exam  Constitutional: She is oriented to person, place, and time. She appears well-developed and well-nourished. No distress.  HENT:   Head: Normocephalic and atraumatic.  Cardiovascular: Normal rate, regular rhythm, normal heart sounds and intact distal pulses.   No murmur heard. Pulmonary/Chest: Effort normal and breath sounds normal. No respiratory distress. She has no wheezes. She has no rales.  Abdominal: Soft. Normal appearance. She exhibits no distension and no mass. There is no hepatosplenomegaly. There is tenderness in the suprapubic area. There is no rigidity, no rebound, no guarding, no CVA tenderness and no tenderness at McBurney's point.  Mild ttp of the suprapubic region.  Remaining abdomen is soft, non-tender without guarding or rebound tenderness. No CVA tenderness  Musculoskeletal: Normal range of motion. She exhibits no edema.  Pain to right hip reproduced with internal rotation.  5/5 strength against resistance of bilat LE's.  No lumbar spine tenderness.  Neurological: She is alert and oriented to person, place, and time. Coordination normal.  Skin: Skin is warm and dry. No rash noted.  Nursing note and vitals reviewed.   ED Course  Procedures (including critical care time) Labs Review Labs Reviewed  URINALYSIS, ROUTINE W REFLEX MICROSCOPIC (NOT [facility]) - Abnormal; Notable for the following:    Hgb urine dipstick SMALL (*)    Leukocytes, UA SMALL (*)    All other components within normal limits  URINE MICROSCOPIC-ADD ON - Abnormal; Notable for the following:    Squamous Epithelial / LPF MANY (*)    Bacteria, UA FEW (*)    All other components within normal limits  URINE CULTURE    Imaging Review No results found.   EKG Interpretation None       Urine culture pending  MDM   Final diagnoses:  Hip pain, chronic, right  UTI (urinary tract infection), uncomplicated   Pt had previous XR of right hip that shows deg changes of the hip.  Ambulates with steady gait.  No focal neuro deficits.    Pt is well appearing.  Non-toxic appearing.  Vitals stable.  UTI w/o concerning sx's for  pyelonephritis.      Rosey Bathammy Inis Borneman, PA-C 05/28/15 2130  Glynn OctaveStephen Rancour, MD 05/29/15 [phone]

## 2015-07-24 ENCOUNTER — Encounter (HOSPITAL_COMMUNITY): Payer: Self-pay | Admitting: Emergency Medicine

## 2015-07-24 ENCOUNTER — Emergency Department (HOSPITAL_COMMUNITY): Payer: Medicaid Other

## 2015-07-24 ENCOUNTER — Emergency Department (HOSPITAL_COMMUNITY)
Admission: EM | Admit: 2015-07-24 | Discharge: 2015-07-24 | Disposition: A | Discharge status: Home / Self Care | Payer: Medicaid Other | Attending: Emergency Medicine | Admitting: Emergency Medicine

## 2015-07-24 DIAGNOSIS — Z79899 Other long term (current) drug therapy: Secondary | ICD-10-CM | POA: Insufficient documentation

## 2015-07-24 DIAGNOSIS — J45909 Unspecified asthma, uncomplicated: Secondary | ICD-10-CM | POA: Diagnosis not present

## 2015-07-24 DIAGNOSIS — Y998 Other external cause status: Secondary | ICD-10-CM | POA: Diagnosis not present

## 2015-07-24 DIAGNOSIS — Y9289 Other specified places as the place of occurrence of the external cause: Secondary | ICD-10-CM | POA: Diagnosis not present

## 2015-07-24 DIAGNOSIS — Y9389 Activity, other specified: Secondary | ICD-10-CM | POA: Insufficient documentation

## 2015-07-24 DIAGNOSIS — S7001XA Contusion of right hip, initial encounter: Secondary | ICD-10-CM | POA: Diagnosis not present

## 2015-07-24 DIAGNOSIS — Z720 Tobacco use: Secondary | ICD-10-CM | POA: Diagnosis not present

## 2015-07-24 DIAGNOSIS — W1849XA Other slipping, tripping and stumbling without falling, initial encounter: Secondary | ICD-10-CM | POA: Diagnosis not present

## 2015-07-24 DIAGNOSIS — M25551 Pain in right hip: Secondary | ICD-10-CM

## 2015-07-24 DIAGNOSIS — I10 Essential (primary) hypertension: Secondary | ICD-10-CM | POA: Diagnosis not present

## 2015-07-24 DIAGNOSIS — S79911A Unspecified injury of right hip, initial encounter: Secondary | ICD-10-CM | POA: Diagnosis present

## 2015-07-24 MED ORDER — MELOXICAM 15 MG PO TABS: 15.0000 mg | ORAL_TABLET | Freq: Every day | ORAL | Status: DC

## 2015-07-24 NOTE — Discharge Instructions (Signed)
Osteoarthritis °Osteoarthritis is a disease that causes soreness and inflammation of a joint. It occurs when the cartilage at the affected joint wears down. Cartilage acts as a cushion, covering the ends of bones where they meet to form a joint. Osteoarthritis is the most common form of arthritis. It often occurs in older people. The joints affected most often by this condition include those in the: °· Ends of the fingers. °· Thumbs. °· Neck. °· Lower back. °· Knees. °· Hips. °CAUSES  °Over time, the cartilage that covers the ends of bones begins to wear away. This causes bone to rub on bone, producing pain and stiffness in the affected joints.  °RISK FACTORS °Certain factors can increase your chances of having osteoarthritis, including: °· Older age. °· Excessive body weight. °· Overuse of joints. °· Previous joint injury. °SIGNS AND SYMPTOMS  °· Pain, swelling, and stiffness in the joint. °· Over time, the joint may lose its normal shape. °· Small deposits of bone (osteophytes) may grow on the edges of the joint. °· Bits of bone or cartilage can break off and float inside the joint space. This may cause more pain and damage. °DIAGNOSIS  °Your health care provider will do a physical exam and ask about your symptoms. Various tests may be ordered, such as: °· X-rays of the affected joint. °· An MRI scan. °· Blood tests to rule out other types of arthritis. °· Joint fluid tests. This involves using a needle to draw fluid from the joint and examining the fluid under a microscope. °TREATMENT  °Goals of treatment are to control pain and improve joint function. Treatment plans may include: °· A prescribed exercise program that allows for rest and joint relief. °· A weight control plan. °· Pain relief techniques, such as: °¨ Properly applied heat and cold. °¨ Electric pulses delivered to nerve endings under the skin (transcutaneous electrical nerve stimulation [TENS]). °¨ Massage. °¨ Certain nutritional  supplements. °· Medicines to control pain, such as: °¨ Acetaminophen. °¨ Nonsteroidal anti-inflammatory drugs (NSAIDs), such as naproxen. °¨ Narcotic or central-acting agents, such as tramadol. °¨ Corticosteroids. These can be given orally or as an injection. °· Surgery to reposition the bones and relieve pain (osteotomy) or to remove loose pieces of bone and cartilage. Joint replacement may be needed in advanced states of osteoarthritis. °HOME CARE INSTRUCTIONS  °· Take medicines only as directed by your health care provider. °· Maintain a healthy weight. Follow your health care provider's instructions for weight control. This may include dietary instructions. °· Exercise as directed. Your health care provider can recommend specific types of exercise. These may include: °¨ Strengthening exercises. These are done to strengthen the muscles that support joints affected by arthritis. They can be performed with weights or with exercise bands to add resistance. °¨ Aerobic activities. These are exercises, such as brisk walking or low-impact aerobics, that get your heart pumping. °¨ Range-of-motion activities. These keep your joints limber. °¨ Balance and agility exercises. These help you maintain daily living skills. °· Rest your affected joints as directed by your health care provider. °· Keep all follow-up visits as directed by your health care provider. °SEEK MEDICAL CARE IF:  °· Your skin turns red. °· You develop a rash in addition to your joint pain. °· You have worsening joint pain. °· You have a fever along with joint or muscle aches. °SEEK IMMEDIATE MEDICAL CARE IF: °· You have a significant loss of weight or appetite. °· You have night sweats. °FOR MORE   INFORMATION   [employer] Arthritis and Musculoskeletal and Skin Diseases: www.niams.http://www.myers.net/  [employer] on Aging: https://walker.com/  American College of Rheumatology: www.rheumatology.org Document Released: 10/31/2005 Document Revised:  03/17/2014 Document Reviewed: 07/08/2013 [facility] Patient Information 2015 [place], [place]. This information is not intended to replace advice given to you by your health care provider. Make sure you discuss any questions you have with your health care provider. Avascular Necrosis Avascular necrosis is a disease resulting from the temporary or permanent loss of the blood supply to the bones. Without blood, the bone tissue dies and causes the bone to become soft. If the process involves the bone near a joint, it may lead to collapse of the joint surface. This disease is also known as:  Osteonecrosis.  Aseptic necrosis.  Ischemic bone necrosis. Avascular necrosis most commonly affects the ends (epiphysis) of long bones. The femur, the bone extending from the knee joint to the hip joint, is the bone most commonly involved. The disease may affect 1 bone, more than 1 bone at the same time, more than 1 bone at different times. It affects men and women equally. Avascular necrosis occurs at any age. But it is more common between the ages of 44 and 50 years. SYMPTOMS  In early stages patients may not have any symptoms. But as the disease progresses, joint pain generally develops. At first there is pain when putting weight on the affected joint, and then when resting. Pain usually develops gradually. It may be mild or severe. As the disease progresses and the bone and surrounding joint surface collapses, pain may develop or increase dramatically. Pain may be severe enough to limit range of motion in the affected joint. The period of time between the first symptoms and loss of joint function is different for each patient. This can range from several months to more than a year. Disability depends on:  What part of the bone is affected.  How large an area is involved.  How effectively the bone repairs itself.  If other illnesses are present.  If you are being treated for cancer with medications  (chemotherapy).  Radiation.  The cause of the avascular necrosis. DIAGNOSIS  The diagnosis of aseptic necrosis is usually made by:  Taking a history.  Doing an exam.  Taking X-rays. (If X-rays are normal, an MRI may be required.)  Sometimes further blood work and specialized studies may be necessary. TREATMENT  Treatment for this disease is necessary to maintain joint function. If untreated, most patients will suffer severe pain and limitation in movement within 2 years. Several treatments are available that help prevent further bone and joint damage. They can also reduce pain. To determine the most appropriate treatment, the caregiver considers the following aspects of a patient's disease:  The age of the patient.  The stage of the disease (early or late).  The location and amount of bone affected. It may be a small or large area.  The underlying cause of avascular necrosis. The goals in treatment are to:  Improve the patient's use of the affected joint.  Stop further damage to the bone.  Improve bone and joint survival. Your caregiver may use one or more of the following treatments:  Reduced weight bearing. If avascular necrosis is diagnosed early, the caregiver may begin treatment by having the patient limit weight on the affected joint. The caregiver may recommend limiting activities or using crutches. In some cases, reduced weight bearing can slow the damage caused by the disease and  permit natural healing. When combined with medication to reduce pain, reduced weight bearing can be an effective way to avoid or delay surgery for some patients. Most patients eventually will need surgery to reconstruct the joint.  Core decompression. Core decompression works best in people who are in the earliest stages of avascular necrosis, before the collapse of the joint. This procedure often can reduce pain and slow the progression of bone and joint destruction in these patients. This  surgical procedure removes the inner layer of bone, which:  Reduces pressure within the bone.  Increases blood flow to the bone.  Allows more blood vessels to form.  Reduces pain.  Osteotomy. This surgical procedure re-shapes the bone to reduce stress on the affected area of the joint. There is a lengthy recovery period. The patient's activities are very limited for 3 to 12 months after an osteotomy. This procedure is most effective for younger patients with advanced avascular necrosis, and those with a large area of affected bone.  Bone Graft. A bone graft may be used to support a joint after core decompression. Bone grafting is surgery that transplants healthy bone from one part of the patient, such as the leg, to the diseased area. Sometimes the bone is taken with it's blood vessels which are attached to local blood vessels near the area of bone collapse. This is called a vascularized bone graft. There is a lengthy recovery period after a bone graft, usually from 6 to 12 months. This procedure is technically complex.  Arthroplasty. Arthroplasty is also known as total joint replacement. Total joint replacement is used in late-stage avascular necrosis, and when the joint is deformed. In this surgery, the diseased joint is replaced with artificial parts. It may be recommended for people who are not good candidates for other treatments, such as patients who may not do well with repeated attempts to preserve the joint. Various types of replacements are available, and patients should discuss specific needs with their caregiver. New treatments being tried include:  The use of medications.  Electrical stimulation.  Combination therapies to increase the growth of new bone and blood vessels. Document Released: 04/22/2002 Document Revised: 01/23/2012 Document Reviewed: 01/08/2014 [facility] Patient Information 2015 [place], [place]. This information is not intended to replace advice given to you by your  health care provider. Make sure you discuss any questions you have with your health care provider.

## 2015-07-24 NOTE — ED Provider Notes (Signed)
CSN: [id number]     Arrival date & time 07/24/15  1037 History   First MD Initiated Contact with Patient 07/24/15 1129     Chief Complaint  Patient presents with  . Hip Pain     (Consider location/radiation/quality/duration/timing/severity/associated sxs/prior Treatment) Patient is a 39 y.o. female presenting with fall. The history is provided by the patient. No language interpreter was used.  Fall This is a new problem. The current episode started 3 to 5 hours ago. The problem occurs constantly. The problem has not changed since onset.Pertinent negatives include no chest pain and no abdominal pain. Nothing aggravates the symptoms. Nothing relieves the symptoms. She has tried nothing for the symptoms.  Pt has chronic hip and back problems.   Pt is not currently being seen by Orthopaedist.    Past Medical History  Diagnosis Date  . Hypertension   . Asthma    Past Surgical History  Procedure Laterality Date  . Tonsillectomy    . Tubal ligation     Family History  Problem Relation Age of Onset  . Diabetes Mother    Social History  Substance Use Topics  . Smoking status: Current Every Day Smoker -- 0.50 packs/day    Types: Cigarettes  . Smokeless tobacco: Never Used  . Alcohol Use: No   OB History    Gravida Para Term Preterm AB TAB SAB Ectopic Multiple Living   Review of Systems  Cardiovascular: Negative for chest pain.  Gastrointestinal: Negative for abdominal pain.  Musculoskeletal: Positive for myalgias and joint swelling.  All other systems reviewed and are negative.     Allergies  Review of patient's allergies indicates no known allergies.  Home Medications   Prior to Admission medications   Medication Sig Start Date End Date Taking? Authorizing Provider  cyclobenzaprine (FLEXERIL) 10 MG tablet Take 10 mg by mouth at bedtime.  01/28/14  Yes Historical Provider, MD  lisinopril-hydrochlorothiazide (PRINZIDE,ZESTORETIC) 20-12.5 MG per tablet  Take 1 tablet by mouth daily.  01/28/14  Yes Historical Provider, MD  meloxicam (MOBIC) 15 MG tablet Take 15 mg by mouth daily.  03/05/14  Yes Historical Provider, MD  zolpidem (AMBIEN) 10 MG tablet Take 10 mg by mouth at bedtime.  06/09/14  Yes Historical Provider, MD  cephALEXin (KEFLEX) 500 MG capsule Take 1 capsule (500 mg total) by mouth 4 (four) times daily. For 7 days Patient not taking: Reported on 07/24/2015 05/25/15   Tammy Triplett, PA-C  cetirizine-pseudoephedrine (ZYRTEC-D) 5-120 MG per tablet Take 1 tablet by mouth 2 (two) times daily. Patient not taking: Reported on 07/24/2015 02/04/15   Burgess Amor, PA-C  HYDROcodone-acetaminophen (NORCO/VICODIN) 5-325 MG per tablet Take one tab po q 4-6 hrs prn pain Patient not taking: Reported on 07/24/2015 05/25/15   Tammy Triplett, PA-C  promethazine-codeine (PHENERGAN WITH CODEINE) 6.25-10 MG/5ML syrup Take 5 mLs by mouth every 4 (four) hours as needed for cough (and throat pain). Patient not taking: Reported on 07/24/2015 02/04/15   Burgess Amor, PA-C   BP 156/106 mmHg  Pulse 67  Temp(Src) 97.7 F (36.5 C) (Oral)  Resp 16  Ht  (1.753 m)  Wt 192 lb (87.091 kg)  BMI 28.34 kg/m2  SpO2 100% Physical Exam  Constitutional: She is oriented to person, place, and time. She appears well-developed and well-nourished.  HENT:  Head: Normocephalic and atraumatic.  Eyes: EOM are normal. Pupils are equal, round, and reactive to light.  Neck: Normal range of motion.  Cardiovascular: Normal rate.   Pulmonary/Chest: Effort normal.  Abdominal: She exhibits no distension.  Musculoskeletal: She exhibits tenderness.  Tender right hip.  Pain with range of motion    Neurological: She is alert and oriented to person, place, and time.  Psychiatric: She has a normal mood and affect.  Nursing note and vitals reviewed.   ED Course  Procedures (including critical care time) Labs Review Labs Reviewed - No data to display  Imaging Review Dg Hip Unilat With Pelvis  1v Right  07/24/2015   CLINICAL DATA:  Pain following fall  EXAM: DG HIP (WITH OR WITHOUT PELVIS) 1V RIGHT  COMPARISON:  September 01, 2014  FINDINGS: Frontal pelvis as well as weightbearing frontal and lateral right hip images were obtained. There is no acute fracture or dislocation. There is advanced osteoarthritic change in the right hip joint with extensive subchondral cystic change and bony eburnation along the acetabulum. There is bony overgrowth along the lateral right acetabulum. There is questionable avascular necrosis in the right femoral head. There is slight narrowing of the left hip joint. There is slight osteoarthritic change in the pubic symphysis region as well.  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 Question avascular necrosis in the right femoral head versus severe osteoarthritic change. Both entities may exist concurrently. MR could be helpful for assessing degree of potential avascular necrosis in this area.  No acute fracture or dislocation. Slight narrowing left hip joint. Mild osteoarthritic change in pubic symphysis region.   Electronically Signed   By: Bretta Bang III M.D.   On: 07/24/2015 12:00   I have personally reviewed and evaluated these images and lab results as part of my medical decision-making.   EKG Interpretation None      MDM Pt has severe degenerative changes in hip.  Pt is aware.  She has been told she will need a hip replacement.    Final diagnoses:  Hip pain, acute, right  Contusion of right hip, initial encounter   Meds ordered this encounter  Medications  . lisinopril-hydrochlorothiazide (PRINZIDE,ZESTORETIC) 20-12.5 MG per tablet    Sig: Take 1 tablet by mouth daily.   . cyclobenzaprine (FLEXERIL) 10 MG tablet    Sig: Take 10 mg by mouth at bedtime.   Marland Kitchen zolpidem (AMBIEN) 10 MG tablet    Sig: Take 10 mg by mouth at bedtime.   Marland Kitchen DISCONTD: meloxicam (MOBIC) 15 MG tablet    Sig:  Take 15 mg by mouth daily.   . meloxicam (MOBIC) 15 MG tablet    Sig: Take 1 tablet (15 mg total) by mouth daily.    Dispense:  30 tablet    Refill:  0    Order Specific Question:  Supervising Provider    Answer:  Eber Hong [[id number]]        Lonia Skinner [place], PA-C 07/24/15 1800  Bethann Berkshire, MD 07/25/15 1022

## 2015-07-24 NOTE — ED Notes (Signed)
Patient ambulated to nursing station with no difficulty.

## 2015-07-24 NOTE — ED Notes (Signed)
Patient c/o right hip pain. Per patient slipped this morning coming down ramp, landing on hip. Denies any LOC or hitting head. Patient ambulated to triage, limp noted. No shortening or rotation noted.

## 2015-07-24 NOTE — ED Notes (Signed)
Reviewed discharge instructions. Referral to ortho explained. Patient had no questions at time of discharge. Work note given. Offer patient wheelchair but she stated she was okay walking out. Left with NAD with family.

## 2015-09-28 ENCOUNTER — Other Ambulatory Visit (HOSPITAL_COMMUNITY): Payer: Self-pay | Admitting: Orthopaedic Surgery

## 2015-09-29 ENCOUNTER — Emergency Department (HOSPITAL_COMMUNITY): Payer: Managed Care, Other (non HMO)

## 2015-09-29 ENCOUNTER — Encounter (HOSPITAL_COMMUNITY): Payer: Self-pay | Admitting: Emergency Medicine

## 2015-09-29 ENCOUNTER — Emergency Department (HOSPITAL_COMMUNITY)
Admission: EM | Admit: 2015-09-29 | Discharge: 2015-09-29 | Disposition: A | Discharge status: Home / Self Care | Payer: Managed Care, Other (non HMO) | Attending: Emergency Medicine | Admitting: Emergency Medicine

## 2015-09-29 DIAGNOSIS — F17210 Nicotine dependence, cigarettes, uncomplicated: Secondary | ICD-10-CM | POA: Diagnosis not present

## 2015-09-29 DIAGNOSIS — I10 Essential (primary) hypertension: Secondary | ICD-10-CM | POA: Insufficient documentation

## 2015-09-29 DIAGNOSIS — Y9289 Other specified places as the place of occurrence of the external cause: Secondary | ICD-10-CM | POA: Insufficient documentation

## 2015-09-29 DIAGNOSIS — M1990 Unspecified osteoarthritis, unspecified site: Secondary | ICD-10-CM | POA: Insufficient documentation

## 2015-09-29 DIAGNOSIS — S9031XA Contusion of right foot, initial encounter: Secondary | ICD-10-CM | POA: Diagnosis not present

## 2015-09-29 DIAGNOSIS — Z79899 Other long term (current) drug therapy: Secondary | ICD-10-CM | POA: Insufficient documentation

## 2015-09-29 DIAGNOSIS — Y998 Other external cause status: Secondary | ICD-10-CM | POA: Diagnosis not present

## 2015-09-29 DIAGNOSIS — Y9389 Activity, other specified: Secondary | ICD-10-CM | POA: Diagnosis not present

## 2015-09-29 DIAGNOSIS — M79671 Pain in right foot: Secondary | ICD-10-CM | POA: Diagnosis present

## 2015-09-29 DIAGNOSIS — M25551 Pain in right hip: Secondary | ICD-10-CM | POA: Diagnosis not present

## 2015-09-29 DIAGNOSIS — J45909 Unspecified asthma, uncomplicated: Secondary | ICD-10-CM | POA: Diagnosis not present

## 2015-09-29 DIAGNOSIS — W208XXA Other cause of strike by thrown, projected or falling object, initial encounter: Secondary | ICD-10-CM | POA: Diagnosis not present

## 2015-09-29 MED ORDER — TRAMADOL HCL 50 MG PO TABS: 50.0000 mg | ORAL_TABLET | Freq: Four times a day (QID) | ORAL | Status: DC | PRN

## 2015-09-29 MED ORDER — TRAMADOL HCL 50 MG PO TABS
50.0000 mg | ORAL_TABLET | Freq: Once | ORAL | Status: AC
  Administered 2015-09-29: 50 mg via ORAL
  Filled 2015-09-29: qty 1

## 2015-09-29 NOTE — ED Provider Notes (Signed)
CSN: [id number]     Arrival date & time 09/29/15  7829 History   First MD Initiated Contact with Patient 09/29/15 0809     Chief Complaint  Patient presents with  . Foot Pain     (Consider location/radiation/quality/duration/timing/severity/associated sxs/prior Treatment) The history is provided by the patient.   Mariah Bell is a 39 y.o. female with history significant for advanced right hip arthritis and is anticipating hip replacement surgery next week presenting with right foot pain since dropping a coffee table on the right foot yesterday morning.  Her pain is located in her distal third fourth and fifth toes along with distal foot.  She describes waking with increased swelling today and difficulty ambulating.  She has had no medications or treatment specifically for this injury.  She does take meloxicam daily for her hip pain.  She denies weakness or numbness in her toes, no radiation of pain.     Past Medical History  Diagnosis Date  . Hypertension   . Asthma    Past Surgical History  Procedure Laterality Date  . Tonsillectomy    . Tubal ligation     Family History  Problem Relation Age of Onset  . Diabetes Mother    Social History  Substance Use Topics  . Smoking status: Current Every Day Smoker -- 0.50 packs/day    Types: Cigarettes  . Smokeless tobacco: Never Used  . Alcohol Use: No   OB History    Gravida Para Term Preterm AB TAB SAB Ectopic Multiple Living   Review of Systems  Constitutional: Negative for fever.  Musculoskeletal: Positive for arthralgias. Negative for myalgias and joint swelling.  Skin: Negative for color change and wound.  Neurological: Negative for weakness and numbness.      Allergies  Review of patient's allergies indicates no known allergies.  Home Medications   Prior to Admission medications   Medication Sig Start Date End Date Taking? Authorizing Provider  cephALEXin (KEFLEX) 500 MG capsule Take  1 capsule (500 mg total) by mouth 4 (four) times daily. For 7 days Patient not taking: Reported on 07/24/2015 05/25/15   Tammy Triplett, PA-C  cetirizine-pseudoephedrine (ZYRTEC-D) 5-120 MG per tablet Take 1 tablet by mouth 2 (two) times daily. Patient not taking: Reported on 07/24/2015 02/04/15   Burgess Amor, PA-C  cyclobenzaprine (FLEXERIL) 10 MG tablet Take 10 mg by mouth at bedtime.  01/28/14   Historical Provider, MD  HYDROcodone-acetaminophen (NORCO/VICODIN) 5-325 MG per tablet Take one tab po q 4-6 hrs prn pain Patient not taking: Reported on 07/24/2015 05/25/15   Tammy Triplett, PA-C  lisinopril-hydrochlorothiazide (PRINZIDE,ZESTORETIC) 20-12.5 MG per tablet Take 1 tablet by mouth daily.  01/28/14   Historical Provider, MD  meloxicam (MOBIC) 15 MG tablet Take 1 tablet (15 mg total) by mouth daily. 07/24/15   Elson Areas, PA-C  promethazine-codeine (PHENERGAN WITH CODEINE) 6.25-10 MG/5ML syrup Take 5 mLs by mouth every 4 (four) hours as needed for cough (and throat pain). Patient not taking: Reported on 07/24/2015 02/04/15   Burgess Amor, PA-C  traMADol (ULTRAM) 50 MG tablet Take 1 tablet (50 mg total) by mouth every 6 (six) hours as needed. 09/29/15   Burgess Amor, PA-C  zolpidem (AMBIEN) 10 MG tablet Take 10 mg by mouth at bedtime.  06/09/14   Historical Provider, MD   BP 146/102 mmHg  Pulse 74  Temp(Src) 97.7 F (36.5 C) (Oral)  Resp 22  Ht 5\' 9"  (1.753 m)  Wt 200 lb (90.719 kg)  BMI 29.52 kg/m2  SpO2 96%  LMP 08/29/2015 Physical Exam  Constitutional: She appears well-developed and well-nourished.  HENT:  Head: Atraumatic.  Neck: Normal range of motion.  Cardiovascular:  Pulses equal bilaterally  Musculoskeletal: She exhibits tenderness.       Right foot: There is no swelling, normal capillary refill, no crepitus, no deformity and no laceration.       Feet:  Neurological: She is alert. She has normal strength. She displays normal reflexes. No sensory deficit.  Skin: Skin is warm and dry.    Psychiatric: She has a normal mood and affect.    ED Course  Procedures (including critical care time) Labs Review Labs Reviewed - No data to display  Imaging Review Dg Foot Complete Right  09/29/2015  CLINICAL DATA:  39 year old female with pain in the right foot third through fifth toes after a coffee table fell onto the right foot yesterday. EXAM: RIGHT FOOT COMPLETE - 3+ VIEW COMPARISON:  No priors. FINDINGS: Multiple views of the right foot demonstrate no acute displaced fracture, subluxation, dislocation, or soft tissue abnormality. Pes planus. IMPRESSION: No acute radiographic abnormality of the right foot. Electronically Signed   By: Trudie Reedaniel  Entrikin M.D.   On: 09/29/2015 08:15   I have personally reviewed and evaluated these images and lab results as part of my medical decision-making.   EKG Interpretation None      MDM   Final diagnoses:  Foot contusion, right, initial encounter    Patient was given a postop shoe, tramadol prescribed.  Encouraged to continue using her meloxicam.  Rest ice, elevation as much as possible.  Follow up with Dr. Magnus IvanBlackman as she has scheduled next week.    Burgess AmorJulie Nachelle Negrette, PA-C 09/29/15 [id number]  Bethann BerkshireJoseph Zammit, MD 09/29/15 [phone]

## 2015-09-29 NOTE — Discharge Instructions (Signed)

## 2015-09-29 NOTE — ED Notes (Signed)
Pt states she was moving a table when it fell on her right foot. Pt states she noticed she could not move her toes last night and has had pain with walking.

## 2015-10-01 ENCOUNTER — Other Ambulatory Visit (HOSPITAL_COMMUNITY): Payer: Self-pay | Admitting: *Deleted

## 2015-10-01 NOTE — Patient Instructions (Addendum)
RENAYE JANICKI  10/01/2015   Your procedure is scheduled on: Friday 10-09-15  Report to [facility]  Entrance take [place]  elevators to 3rd floor to  Short Stay Center at 1015 AM.  Call this number if you have problems the morning of surgery [phone]   Remember: ONLY 1 PERSON MAY GO WITH YOU TO SHORT STAY TO GET  READY MORNING OF YOUR SURGERY.  Do not eat food or drink liquids :After Midnight.     Take these medicines the morning of surgery with A SIP OF WATER: none                               You may not have any metal on your body including hair pins and              piercings  Do not wear jewelry, make-up, lotions, powders or perfumes, deodorant             Do not wear nail polish.  Do not shave  48 hours prior to surgery.              Men may shave face and neck.   Do not bring valuables to the hospital. [facility] IS NOT             RESPONSIBLE   FOR VALUABLES.  Contacts, dentures or bridgework may not be worn into surgery.  Leave suitcase in the car. After surgery it may be brought to your room.     Special Instructions: practice deep breathing and leg exercises              Please read over the following fact sheets you were given: _____________________________________________________________________             [facility] - Preparing for Surgery Before surgery, you can play an important role.  Because skin is not sterile, your skin needs to be as free of germs as possible.  You can reduce the number of germs on your skin by washing with CHG (chlorahexidine gluconate) soap before surgery.  CHG is an antiseptic cleaner which kills germs and bonds with the skin to continue killing germs even after washing. Please DO NOT use if you have an allergy to CHG or antibacterial soaps.  If your skin becomes reddened/irritated stop using the CHG and inform your nurse when you arrive at Short Stay. Do not shave (including legs and underarms) for  at least 48 hours prior to the first CHG shower.  You may shave your face/neck. Please follow these instructions carefully:  1.  Shower with CHG Soap the night before surgery and the  morning of Surgery.  2.  If you choose to wash your hair, wash your hair first as usual with your  normal  shampoo.  3.  After you shampoo, rinse your hair and body thoroughly to remove the  shampoo.                           4.  Use CHG as you would any other liquid soap.  You can apply chg directly  to the skin and wash                       Gently with a scrungie or clean  washcloth.  5.  Apply the CHG Soap to your body ONLY FROM THE NECK DOWN.   Do not use on face/ open                           Wound or open sores. Avoid contact with eyes, ears mouth and genitals (private parts).                       Wash face,  Genitals (private parts) with your normal soap.             6.  Wash thoroughly, paying special attention to the area where your surgery  will be performed.  7.  Thoroughly rinse your body with warm water from the neck down.  8.  DO NOT shower/wash with your normal soap after using and rinsing off  the CHG Soap.                9.  Pat yourself dry with a clean towel.            10.  Wear clean pajamas.            11.  Place clean sheets on your bed the night of your first shower and do not  sleep with pets. Day of Surgery : Do not apply any lotions/deodorants the morning of surgery.  Please wear clean clothes to the hospital/surgery center.  FAILURE TO FOLLOW THESE INSTRUCTIONS MAY RESULT IN THE CANCELLATION OF YOUR SURGERY PATIENT SIGNATURE_________________________________  NURSE SIGNATURE__________________________________  ________________________________________________________________________  WHAT IS A BLOOD TRANSFUSION? Blood Transfusion Information  A transfusion is the replacement of blood or some of its parts. Blood is made up of multiple cells which provide different functions.  Red  blood cells carry oxygen and are used for blood loss replacement.  White blood cells fight against infection.  Platelets control bleeding.  Plasma helps clot blood.  Other blood products are available for specialized needs, such as hemophilia or other clotting disorders. BEFORE THE TRANSFUSION  Who gives blood for transfusions?   Healthy volunteers who are fully evaluated to make sure their blood is safe. This is blood bank blood. Transfusion therapy is the safest it has ever been in the practice of medicine. Before blood is taken from a donor, a complete history is taken to make sure that person has no history of diseases nor engages in risky social behavior (examples are intravenous drug use or sexual activity with multiple partners). The donor's travel history is screened to minimize risk of transmitting infections, such as malaria. The donated blood is tested for signs of infectious diseases, such as HIV and hepatitis. The blood is then tested to be sure it is compatible with you in order to minimize the chance of a transfusion reaction. If you or a relative donates blood, this is often done in anticipation of surgery and is not appropriate for emergency situations. It takes many days to process the donated blood. RISKS AND COMPLICATIONS Although transfusion therapy is very safe and saves many lives, the main dangers of transfusion include:   Getting an infectious disease.  Developing a transfusion reaction. This is an allergic reaction to something in the blood you were given. Every precaution is taken to prevent this. The decision to have a blood transfusion has been considered carefully by your caregiver before blood is given. Blood is not given unless the benefits outweigh the risks. AFTER THE TRANSFUSION  Right after receiving a blood transfusion, you will usually feel much better and more energetic. This is especially true if your red blood cells have gotten low (anemic). The  transfusion raises the level of the red blood cells which carry oxygen, and this usually causes an energy increase.  The nurse administering the transfusion will monitor you carefully for complications. HOME CARE INSTRUCTIONS  No special instructions are needed after a transfusion. You may find your energy is better. Speak with your caregiver about any limitations on activity for underlying diseases you may have. SEEK MEDICAL CARE IF:   Your condition is not improving after your transfusion.  You develop redness or irritation at the intravenous (IV) site. SEEK IMMEDIATE MEDICAL CARE IF:  Any of the following symptoms occur over the next 12 hours:  Shaking chills.  You have a temperature by mouth above 102 F (38.9 C), not controlled by medicine.  Chest, back, or muscle pain.  People around you feel you are not acting correctly or are confused.  Shortness of breath or difficulty breathing.  Dizziness and fainting.  You get a rash or develop hives.  You have a decrease in urine output.  Your urine turns a dark color or changes to pink, red, or brown. Any of the following symptoms occur over the next 10 days:  You have a temperature by mouth above 102 F (38.9 C), not controlled by medicine.  Shortness of breath.  Weakness after normal activity.  The white part of the eye turns yellow (jaundice).  You have a decrease in the amount of urine or are urinating less often.  Your urine turns a dark color or changes to pink, red, or brown. Document Released: 10/28/2000 Document Revised: 01/23/2012 Document Reviewed: 06/16/2008 [facility] Patient Information 2014 [employer], [place].  _______________________________________________________________________

## 2015-10-02 ENCOUNTER — Encounter (HOSPITAL_COMMUNITY)
Admission: RE | Admit: 2015-10-02 | Discharge: 2015-10-02 | Disposition: A | Discharge status: Home / Self Care | Payer: Medicaid Other | Source: Ambulatory Visit | Attending: Orthopaedic Surgery | Admitting: Orthopaedic Surgery

## 2015-10-02 ENCOUNTER — Encounter (HOSPITAL_COMMUNITY): Payer: Self-pay

## 2015-10-02 DIAGNOSIS — Z01812 Encounter for preprocedural laboratory examination: Secondary | ICD-10-CM | POA: Insufficient documentation

## 2015-10-02 DIAGNOSIS — Z01810 Encounter for preprocedural cardiovascular examination: Secondary | ICD-10-CM | POA: Insufficient documentation

## 2015-10-02 HISTORY — DX: Unspecified osteoarthritis, unspecified site: M19.90

## 2015-10-02 LAB — CBC
HEMATOCRIT: 41 % (ref 36.0–46.0)
HEMOGLOBIN: 13.8 g/dL (ref 12.0–15.0)
MCH: 31.9 pg (ref 26.0–34.0)
MCHC: 33.7 g/dL (ref 30.0–36.0)
MCV: 94.7 fL (ref 78.0–100.0)
Platelets: 347 10*3/uL (ref 150–400)
RBC: 4.33 MIL/uL (ref 3.87–5.11)
RDW: 12.9 % (ref 11.5–15.5)
WBC: 8.9 10*3/uL (ref 4.0–10.5)

## 2015-10-02 LAB — HCG, SERUM, QUALITATIVE: Preg, Serum: NEGATIVE

## 2015-10-02 LAB — BASIC METABOLIC PANEL
Anion gap: 7 (ref 5–15)
BUN: 9 mg/dL (ref 6–20)
CO2: 28 mmol/L (ref 22–32)
Calcium: 9.8 mg/dL (ref 8.9–10.3)
Chloride: 104 mmol/L (ref 101–111)
Creatinine, Ser: 1.09 mg/dL — ABNORMAL HIGH (ref 0.44–1.00)
GFR calc Af Amer: >60 mL/min (ref >60)
GLUCOSE: 93 mg/dL (ref 65–99)
POTASSIUM: 4 mmol/L (ref 3.5–5.1)
Sodium: 139 mmol/L (ref 135–145)

## 2015-10-02 LAB — ABO/RH: ABO/RH(D): B POS

## 2015-10-02 LAB — TYPE AND SCREEN
ABO/RH(D): B POS
ANTIBODY SCREEN: NEGATIVE

## 2015-10-02 LAB — APTT: aPTT: 31 seconds (ref 24–37)

## 2015-10-02 LAB — SURGICAL PCR SCREEN
MRSA, PCR: NEGATIVE
STAPHYLOCOCCUS AUREUS: POSITIVE — AB

## 2015-10-02 LAB — PROTIME-INR
INR: 1.07 (ref 0.00–1.49)
PROTHROMBIN TIME: 14.1 s (ref 11.6–15.2)

## 2015-10-05 NOTE — Progress Notes (Signed)
Final EKg in EPIC done 10/02/2015.

## 2015-10-09 ENCOUNTER — Inpatient Hospital Stay (HOSPITAL_COMMUNITY): Payer: Managed Care, Other (non HMO)

## 2015-10-09 ENCOUNTER — Inpatient Hospital Stay (HOSPITAL_COMMUNITY)
Admission: RE | Admit: 2015-10-09 | Discharge: 2015-10-12 | DRG: 470 | Disposition: A | Discharge status: Home Health | Payer: Managed Care, Other (non HMO) | Source: Ambulatory Visit | Attending: Orthopaedic Surgery | Admitting: Orthopaedic Surgery

## 2015-10-09 ENCOUNTER — Inpatient Hospital Stay (HOSPITAL_COMMUNITY): Payer: Managed Care, Other (non HMO) | Admitting: Certified Registered Nurse Anesthetist

## 2015-10-09 ENCOUNTER — Encounter (HOSPITAL_COMMUNITY): Payer: Self-pay | Admitting: *Deleted

## 2015-10-09 ENCOUNTER — Encounter (HOSPITAL_COMMUNITY)
Admission: RE | Disposition: A | Discharge status: Home Health | Payer: Self-pay | Source: Ambulatory Visit | Attending: Orthopaedic Surgery

## 2015-10-09 DIAGNOSIS — R52 Pain, unspecified: Secondary | ICD-10-CM

## 2015-10-09 DIAGNOSIS — J45909 Unspecified asthma, uncomplicated: Secondary | ICD-10-CM | POA: Diagnosis present

## 2015-10-09 DIAGNOSIS — Z01812 Encounter for preprocedural laboratory examination: Secondary | ICD-10-CM | POA: Diagnosis not present

## 2015-10-09 DIAGNOSIS — F17210 Nicotine dependence, cigarettes, uncomplicated: Secondary | ICD-10-CM | POA: Diagnosis present

## 2015-10-09 DIAGNOSIS — M25551 Pain in right hip: Secondary | ICD-10-CM | POA: Diagnosis present

## 2015-10-09 DIAGNOSIS — M87051 Idiopathic aseptic necrosis of right femur: Secondary | ICD-10-CM | POA: Diagnosis present

## 2015-10-09 DIAGNOSIS — I10 Essential (primary) hypertension: Secondary | ICD-10-CM | POA: Diagnosis present

## 2015-10-09 DIAGNOSIS — Z96641 Presence of right artificial hip joint: Secondary | ICD-10-CM

## 2015-10-09 HISTORY — PX: TOTAL HIP ARTHROPLASTY: SHX124

## 2015-10-09 SURGERY — ARTHROPLASTY, HIP, TOTAL, ANTERIOR APPROACH
Anesthesia: General | Site: Hip | Laterality: Right

## 2015-10-09 MED ORDER — BUPIVACAINE HCL (PF) 0.5 % IJ SOLN
INTRAMUSCULAR | Status: DC | PRN
  Administered 2015-10-09: 3 mL

## 2015-10-09 MED ORDER — CEFAZOLIN SODIUM-DEXTROSE 2-3 GM-% IV SOLR
INTRAVENOUS | Status: AC
  Filled 2015-10-09: qty 50

## 2015-10-09 MED ORDER — SODIUM CHLORIDE 0.9 % IV SOLN
INTRAVENOUS | Status: DC
  Administered 2015-10-09 – 2015-10-10 (×2): via INTRAVENOUS

## 2015-10-09 MED ORDER — ACETAMINOPHEN 650 MG RE SUPP: 650.0000 mg | Freq: Four times a day (QID) | RECTAL | Status: DC | PRN

## 2015-10-09 MED ORDER — ONDANSETRON HCL 4 MG PO TABS: 4.0000 mg | ORAL_TABLET | Freq: Four times a day (QID) | ORAL | Status: DC | PRN

## 2015-10-09 MED ORDER — PROPOFOL 10 MG/ML IV BOLUS
INTRAVENOUS | Status: DC | PRN
  Administered 2015-10-09: 100 mg via INTRAVENOUS

## 2015-10-09 MED ORDER — ONDANSETRON HCL 4 MG/2ML IJ SOLN
INTRAMUSCULAR | Status: AC
  Filled 2015-10-09: qty 2

## 2015-10-09 MED ORDER — PROPOFOL 10 MG/ML IV BOLUS
INTRAVENOUS | Status: AC
  Filled 2015-10-09: qty 20

## 2015-10-09 MED ORDER — PHENYLEPHRINE HCL 10 MG/ML IJ SOLN
10.0000 mg | INTRAVENOUS | Status: DC | PRN
  Administered 2015-10-09: 50 ug/min via INTRAVENOUS

## 2015-10-09 MED ORDER — ASPIRIN EC 325 MG PO TBEC
325.0000 mg | DELAYED_RELEASE_TABLET | Freq: Two times a day (BID) | ORAL | Status: DC
  Administered 2015-10-10 – 2015-10-12 (×5): 325 mg via ORAL
  Filled 2015-10-09 (×7): qty 1

## 2015-10-09 MED ORDER — DIPHENHYDRAMINE HCL 12.5 MG/5ML PO ELIX
12.5000 mg | ORAL_SOLUTION | ORAL | Status: DC | PRN
  Administered 2015-10-10 (×2): 12.5 mg via ORAL
  Filled 2015-10-09 (×2): qty 5

## 2015-10-09 MED ORDER — PHENOL 1.4 % MT LIQD
1.0000 | OROMUCOSAL | Status: DC | PRN
  Filled 2015-10-09: qty 177

## 2015-10-09 MED ORDER — MIDAZOLAM HCL 2 MG/2ML IJ SOLN
INTRAMUSCULAR | Status: AC
  Filled 2015-10-09: qty 2

## 2015-10-09 MED ORDER — DEXAMETHASONE SODIUM PHOSPHATE 10 MG/ML IJ SOLN
INTRAMUSCULAR | Status: AC
  Filled 2015-10-09: qty 1

## 2015-10-09 MED ORDER — METHOCARBAMOL 1000 MG/10ML IJ SOLN
500.0000 mg | Freq: Four times a day (QID) | INTRAMUSCULAR | Status: DC | PRN
  Administered 2015-10-09 – 2015-10-10 (×3): 500 mg via INTRAVENOUS
  Filled 2015-10-09 (×7): qty 5

## 2015-10-09 MED ORDER — OXYCODONE HCL 5 MG PO TABS
5.0000 mg | ORAL_TABLET | ORAL | Status: DC | PRN
  Administered 2015-10-09 – 2015-10-10 (×4): 5 mg via ORAL
  Administered 2015-10-10 – 2015-10-12 (×9): 10 mg via ORAL
  Filled 2015-10-09 (×5): qty 2
  Filled 2015-10-09: qty 1
  Filled 2015-10-09 (×3): qty 2
  Filled 2015-10-09 (×2): qty 1
  Filled 2015-10-09 (×3): qty 2

## 2015-10-09 MED ORDER — ONDANSETRON HCL 4 MG/2ML IJ SOLN
INTRAMUSCULAR | Status: DC | PRN
  Administered 2015-10-09: 4 mg via INTRAVENOUS

## 2015-10-09 MED ORDER — HYDROMORPHONE HCL 1 MG/ML IJ SOLN
1.0000 mg | INTRAMUSCULAR | Status: DC | PRN
  Administered 2015-10-09 – 2015-10-10 (×5): 1 mg via INTRAVENOUS
  Filled 2015-10-09 (×6): qty 1

## 2015-10-09 MED ORDER — SODIUM CHLORIDE 0.9 % IR SOLN
Status: DC | PRN
  Administered 2015-10-09: 1000 mL

## 2015-10-09 MED ORDER — HYDROMORPHONE HCL 1 MG/ML IJ SOLN: 0.2500 mg | INTRAMUSCULAR | Status: DC | PRN

## 2015-10-09 MED ORDER — DOCUSATE SODIUM 100 MG PO CAPS
100.0000 mg | ORAL_CAPSULE | Freq: Two times a day (BID) | ORAL | Status: DC
  Administered 2015-10-09 – 2015-10-12 (×6): 100 mg via ORAL

## 2015-10-09 MED ORDER — METOCLOPRAMIDE HCL 5 MG/ML IJ SOLN: 5.0000 mg | Freq: Three times a day (TID) | INTRAMUSCULAR | Status: DC | PRN

## 2015-10-09 MED ORDER — ALBUTEROL SULFATE (2.5 MG/3ML) 0.083% IN NEBU: 3.0000 mL | INHALATION_SOLUTION | Freq: Four times a day (QID) | RESPIRATORY_TRACT | Status: DC | PRN

## 2015-10-09 MED ORDER — PROPOFOL 10 MG/ML IV BOLUS
INTRAVENOUS | Status: AC
  Filled 2015-10-09: qty 60

## 2015-10-09 MED ORDER — PROMETHAZINE HCL 25 MG/ML IJ SOLN: 6.2500 mg | INTRAMUSCULAR | Status: DC | PRN

## 2015-10-09 MED ORDER — FENTANYL CITRATE (PF) 100 MCG/2ML IJ SOLN
INTRAMUSCULAR | Status: AC
  Filled 2015-10-09: qty 2

## 2015-10-09 MED ORDER — PROPOFOL 500 MG/50ML IV EMUL
INTRAVENOUS | Status: DC | PRN
  Administered 2015-10-09: 115 ug/kg/min via INTRAVENOUS

## 2015-10-09 MED ORDER — CEFAZOLIN SODIUM-DEXTROSE 2-3 GM-% IV SOLR
2.0000 g | INTRAVENOUS | Status: AC
  Administered 2015-10-09: 2 g via INTRAVENOUS

## 2015-10-09 MED ORDER — POLYETHYLENE GLYCOL 3350 17 G PO PACK
17.0000 g | PACK | Freq: Every day | ORAL | Status: DC | PRN
  Administered 2015-10-11: 17 g via ORAL
  Filled 2015-10-09: qty 1

## 2015-10-09 MED ORDER — PHENYLEPHRINE HCL 10 MG/ML IJ SOLN
INTRAMUSCULAR | Status: DC | PRN
  Administered 2015-10-09 (×4): 80 ug via INTRAVENOUS

## 2015-10-09 MED ORDER — BUPIVACAINE HCL (PF) 0.5 % IJ SOLN
INTRAMUSCULAR | Status: AC
  Filled 2015-10-09: qty 30

## 2015-10-09 MED ORDER — DEXAMETHASONE SODIUM PHOSPHATE 10 MG/ML IJ SOLN
INTRAMUSCULAR | Status: DC | PRN
  Administered 2015-10-09: 10 mg via INTRAVENOUS

## 2015-10-09 MED ORDER — ACETAMINOPHEN 325 MG PO TABS: 650.0000 mg | ORAL_TABLET | Freq: Four times a day (QID) | ORAL | Status: DC | PRN

## 2015-10-09 MED ORDER — ZOLPIDEM TARTRATE 5 MG PO TABS
5.0000 mg | ORAL_TABLET | Freq: Every evening | ORAL | Status: DC | PRN
Start: 2015-10-09 — End: 2015-10-12
  Administered 2015-10-09 – 2015-10-11 (×3): 5 mg via ORAL
  Filled 2015-10-09 (×3): qty 1

## 2015-10-09 MED ORDER — FENTANYL CITRATE (PF) 100 MCG/2ML IJ SOLN
INTRAMUSCULAR | Status: DC | PRN
  Administered 2015-10-09: 100 ug via INTRAVENOUS

## 2015-10-09 MED ORDER — MIDAZOLAM HCL 5 MG/5ML IJ SOLN
INTRAMUSCULAR | Status: DC | PRN
  Administered 2015-10-09: 2 mg via INTRAVENOUS

## 2015-10-09 MED ORDER — ALUM & MAG HYDROXIDE-SIMETH 200-200-20 MG/5ML PO SUSP: 30.0000 mL | ORAL | Status: DC | PRN

## 2015-10-09 MED ORDER — MENTHOL 3 MG MT LOZG: 1.0000 | LOZENGE | OROMUCOSAL | Status: DC | PRN

## 2015-10-09 MED ORDER — LACTATED RINGERS IV SOLN
INTRAVENOUS | Status: DC
  Administered 2015-10-09 (×2): via INTRAVENOUS
  Administered 2015-10-09: 1000 mL via INTRAVENOUS
  Administered 2015-10-09 (×2): via INTRAVENOUS

## 2015-10-09 MED ORDER — METOCLOPRAMIDE HCL 10 MG PO TABS: 5.0000 mg | ORAL_TABLET | Freq: Three times a day (TID) | ORAL | Status: DC | PRN

## 2015-10-09 MED ORDER — METHOCARBAMOL 500 MG PO TABS
500.0000 mg | ORAL_TABLET | Freq: Four times a day (QID) | ORAL | Status: DC | PRN
  Administered 2015-10-10 – 2015-10-12 (×4): 500 mg via ORAL
  Filled 2015-10-09 (×5): qty 1

## 2015-10-09 MED ORDER — ONDANSETRON HCL 4 MG/2ML IJ SOLN
4.0000 mg | Freq: Four times a day (QID) | INTRAMUSCULAR | Status: DC | PRN
  Administered 2015-10-10: 4 mg via INTRAVENOUS
  Filled 2015-10-09: qty 2

## 2015-10-09 MED ORDER — MEPERIDINE HCL 25 MG/ML IJ SOLN
12.5000 mg | Freq: Once | INTRAMUSCULAR | Status: AC
  Administered 2015-10-09: 12.5 mg via INTRAVENOUS
  Filled 2015-10-09: qty 1

## 2015-10-09 MED ORDER — TRANEXAMIC ACID 1000 MG/10ML IV SOLN
1000.0000 mg | INTRAVENOUS | Status: AC
  Administered 2015-10-09: 1000 mg via INTRAVENOUS
  Filled 2015-10-09: qty 10

## 2015-10-09 MED ORDER — CEFAZOLIN SODIUM 1-5 GM-% IV SOLN
1.0000 g | Freq: Four times a day (QID) | INTRAVENOUS | Status: AC
  Administered 2015-10-09 (×2): 1 g via INTRAVENOUS
  Filled 2015-10-09 (×2): qty 50

## 2015-10-09 MED ORDER — NICOTINE 21 MG/24HR TD PT24
21.0000 mg | MEDICATED_PATCH | Freq: Every day | TRANSDERMAL | Status: DC
  Administered 2015-10-09 – 2015-10-11 (×3): 21 mg via TRANSDERMAL
  Filled 2015-10-09 (×4): qty 1

## 2015-10-09 SURGICAL SUPPLY — 32 items
BAG ZIPLOCK 12X15 (MISCELLANEOUS) IMPLANT
BENZOIN TINCTURE PRP APPL 2/3 (GAUZE/BANDAGES/DRESSINGS) ×2 IMPLANT
BLADE SAW SGTL 18X1.27X75 (BLADE) ×2 IMPLANT
CAPT HIP TOTAL 2 ×2 IMPLANT
CELLS DAT CNTRL 66122 CELL SVR (MISCELLANEOUS) ×1 IMPLANT
CLOTH BEACON ORANGE TIMEOUT ST (SAFETY) ×2 IMPLANT
DRAPE STERI IOBAN 125X83 (DRAPES) ×2 IMPLANT
DRAPE U-SHAPE 47X51 STRL (DRAPES) ×4 IMPLANT
DRSG AQUACEL AG ADV 3.5X10 (GAUZE/BANDAGES/DRESSINGS) ×2 IMPLANT
DURAPREP 26ML APPLICATOR (WOUND CARE) ×2 IMPLANT
ELECT REM PT RETURN 9FT ADLT (ELECTROSURGICAL) ×2
ELECTRODE REM PT RTRN 9FT ADLT (ELECTROSURGICAL) ×1 IMPLANT
GAUZE XEROFORM 1X8 LF (GAUZE/BANDAGES/DRESSINGS) IMPLANT
GLOVE BIO SURGEON STRL SZ7.5 (GLOVE) ×2 IMPLANT
GLOVE BIOGEL PI IND STRL 8 (GLOVE) ×2 IMPLANT
GLOVE BIOGEL PI INDICATOR 8 (GLOVE) ×2
GLOVE ECLIPSE 8.0 STRL XLNG CF (GLOVE) ×2 IMPLANT
GOWN STRL REUS W/TWL XL LVL3 (GOWN DISPOSABLE) ×4 IMPLANT
HANDPIECE INTERPULSE COAX TIP (DISPOSABLE) ×1
HOLDER FOLEY CATH W/STRAP (MISCELLANEOUS) ×2 IMPLANT
PACK ANTERIOR HIP CUSTOM (KITS) ×2 IMPLANT
RTRCTR WOUND ALEXIS 18CM MED (MISCELLANEOUS) ×2
SET HNDPC FAN SPRY TIP SCT (DISPOSABLE) ×1 IMPLANT
STAPLER VISISTAT 35W (STAPLE) IMPLANT
STRIP CLOSURE SKIN 1/2X4 (GAUZE/BANDAGES/DRESSINGS) ×2 IMPLANT
SUT ETHIBOND NAB CT1 #1 30IN (SUTURE) ×2 IMPLANT
SUT MNCRL AB 4-0 PS2 18 (SUTURE) ×2 IMPLANT
SUT VIC AB 0 CT1 36 (SUTURE) ×2 IMPLANT
SUT VIC AB 1 CT1 36 (SUTURE) ×2 IMPLANT
SUT VIC AB 2-0 CT1 27 (SUTURE) ×2
SUT VIC AB 2-0 CT1 TAPERPNT 27 (SUTURE) ×2 IMPLANT
TRAY FOLEY W/METER SILVER 14FR (SET/KITS/TRAYS/PACK) ×2 IMPLANT

## 2015-10-09 NOTE — Op Note (Signed)
NAMMart Piggs:  Bell, Mariah            ACCOUNT NO.:  [id number]  MEDICAL RECORD NO.:  [id number]  LOCATION:  1604                         [facility]  PHYSICIAN:  Vanita PandaChristopher Y. Magnus IvanBlackman, M.D.DATE OF BIRTH:  16-Sep-1976  DATE OF PROCEDURE:  10/09/2015 DATE OF DISCHARGE:                              OPERATIVE REPORT   PREOPERATIVE DIAGNOSIS:  Idiopathic end-stage avascular necrosis, right hip.  POSTOPERATIVE DIAGNOSIS:  Idiopathic end-stage avascular necrosis, right hip.  PROCEDURE:  Right total hip arthroplasty with direct anterior approach.  IMPLANTS:  DePuy Sector Gription acetabular component size 50, size 32 +0 neutral polyethylene liner, size 8 Corail femoral component with standard offset, size 32 +1 ceramic hip ball.  SURGEON:  Vanita PandaChristopher Y. Magnus IvanBlackman, M.D.  ASSISTING:  Richardean CanalGilbert Clark, PA-C  ANESTHESIA:  Spinal.  BLOOD LOSS:  Less than 200 mL.  COMPLICATIONS:  None.  INDICATIONS:  Mariah Bell is a 39 year old female with idiopathic avascular necrosis of her right hip.  This has been getting worse over last 2 years.  I have seen her since June where it has been getting rapidly worse.  It is at the point where x-rays show significant cystic and sclerotic changes in the femoral head, has not collapsed yet but there is no joint space remaining and her pain is daily, it has detrimentally affected her activities of daily living, her quality of life, and her mobility.  At this point, she wished to proceed with a total hip arthroplasty.  She understands the risk of acute blood loss anemia, nerve and vessel injury, fracture, infection, dislocation, DVT. She understands her goals are decreased pain, improved mobility, and overall improved quality of life.  PROCEDURE DESCRIPTION:  After informed consent was obtained, appropriate right hip was marked.  She was brought to the operating room and spinal anesthesia was obtained while she was on her stretcher.  She was  then laid supine on the stretcher.  A Foley catheter was placed and then both feet had traction boots applied to them.  Next, she was placed supine on the Hana fracture table with the perineal post in place and both legs in inline skeletal traction devices, but no traction applied.  Her right operative hip was then prepped and draped with DuraPrep and sterile drapes.  Time-out was called, and she was identified as correct patient and correct right hip.  We then made incision inferior and posterior to the anterior superior iliac spine and carried this obliquely down the leg.  We dissected down to the tensor fascia lata muscle.  The tensor fascia was then divided longitudinally, so we could proceed with a direct anterior approach to the hip.  We identified and cauterized the lateral femoral circumflex vessels and identified the hip capsule.  We placed Cobra retractors around the lateral, medial, and femoral neck and then opened up the hip capsule in an L-type format.  We placed Cobra retractors within the hip capsule.  We then made our femoral neck cut with an oscillating saw just proximal to the lesser trochanter and completed this on osteotome.  We placed a corkscrew guide in the femoral head and removed the femoral head in its entirety.  We found fibrillated fractured cartilage consistent with  avascular necrosis with the soft femoral head.  We then cleaned the acetabulum and remnants of acetabular labrum and debris and released the transverse acetabular ligament and placed a bent Hohmann over the medial acetabular rim.  We then began reaming from a size 42 reamer in 2 mm increments up to a size 50 with all reamers under direct visualization and last reamer under direct fluoroscopy, so we could obtain our depth of reaming, our inclination, and anteversion.  Once we were pleased with this, we placed real DePuy Sector Gription acetabular component size 50 and a single screw.  We placed a  real 32 +0 neutral polyethylene liner for that size acetabular component.  Attention was then turned to the femur with the leg externally rotated to 100 degrees, extended and adducted, we were able to use a Mueller retractor medially and Hohmann retractor behind the greater trochanter, released lateral joint capsule and used a box cutting osteotome to enter the femoral canal and a rongeur to lateralize.  We placed 1 single broach to size 8 which was nice and tight.  Given her young age and her tight cortical bone, we trialed a standard neck and a 32 +1 hip ball.  We brought the leg back over and up with traction and internal rotation, reducing the pelvis.  We were pleased with leg lengths, offset, and stability with good range of motion.  We then dislocated the hip and removed the trial components. We placed a real size 8 Corail femoral component with standard offset and the real 32 +1 ceramic hip ball.  We reduced this again into the acetabulum.  We were pleased with stability, leg-lengths, offset, and range of motion.  We then irrigated the soft tissues with normal saline solution using pulsatile lavage.  We closed the joint capsule with interrupted #1 Ethibond suture followed by running #1 Vicryl in the tensor fascia, 0 Vicryl in the deep tissue, 2-0 Vicryl in subcutaneous tissue, 4-0 Monocryl in subcuticular stitch, and Steri-Strips on the skin.  An Aquacel dressing was applied.  She was taken off the Hana table and taken to the recovery room in stable condition.  All final counts were correct.  There were no complications noted.  Of note, Richardean Canal, PA-C, assisted in the entire case.  His assistance was crucial for facilitating all aspects of this case.     Vanita Panda. Magnus Ivan, M.D.     CYB/MEDQ  D:  10/09/2015  T:  10/09/2015  Job:  [id number]

## 2015-10-09 NOTE — Addendum Note (Signed)
Addendum  created 10/09/15 1608 by Sherrian DiversBruce Stephine Langbehn, MD   Modules edited: Orders

## 2015-10-09 NOTE — Anesthesia Procedure Notes (Signed)
Spinal Patient location during procedure: OR Start time: 10/09/2015 12:30 PM End time: 10/09/2015 12:36 PM Staffing Resident/CRNA: Scotti Motter Performed by: resident/CRNA  Preanesthetic Checklist Completed: patient identified, site marked, surgical consent, pre-op evaluation, timeout performed, IV checked, risks and benefits discussed and monitors and equipment checked Spinal Block Patient position: sitting Prep: Betadine Patient monitoring: heart rate, cardiac monitor, continuous pulse ox and blood pressure Approach: midline Location: L3-4 Injection technique: single-shot Needle Needle type: Sprotte  Needle gauge: 24 G Needle length: 10 cm Needle insertion depth: 8 cm Assessment Sensory level: T6 Additional Notes -heme -para VSS Lot [id number] Exp 2017-03-13

## 2015-10-09 NOTE — Anesthesia Preprocedure Evaluation (Signed)
Anesthesia Evaluation  Patient identified by MRN, date of birth, ID band Patient awake    Reviewed: Allergy & Precautions, NPO status , Patient's Chart, lab work & pertinent test results  History of Anesthesia Complications Negative for: history of anesthetic complications  Airway Mallampati: III  TM Distance: >3 FB Neck ROM: Full    Dental no notable dental hx. (+) Dental Advisory Given   Pulmonary asthma , Current Smoker,    Pulmonary exam normal breath sounds clear to auscultation       Cardiovascular hypertension, Pt. on medications Normal cardiovascular exam Rhythm:Regular Rate:Normal     Neuro/Psych negative neurological ROS  negative psych ROS   GI/Hepatic negative GI ROS, Neg liver ROS,   Endo/Other  negative endocrine ROS  Renal/GU negative Renal ROS  negative genitourinary   Musculoskeletal  (+) Arthritis ,   Abdominal   Peds negative pediatric ROS (+)  Hematology negative hematology ROS (+)   Anesthesia Other Findings   Reproductive/Obstetrics negative OB ROS                             Anesthesia Physical Anesthesia Plan  ASA: II  Anesthesia Plan: General and Spinal   Post-op Pain Management:    Induction: Intravenous  Airway Management Planned: Oral ETT  Additional Equipment:   Intra-op Plan:   Post-operative Plan: Extubation in OR  Informed Consent: I have reviewed the patients History and Physical, chart, labs and discussed the procedure including the risks, benefits and alternatives for the proposed anesthesia with the patient or authorized representative who has indicated his/her understanding and acceptance.   Dental advisory given  Plan Discussed with: CRNA  Anesthesia Plan Comments:         Anesthesia Quick Evaluation

## 2015-10-09 NOTE — Transfer of Care (Signed)
Immediate Anesthesia Transfer of Care Note  Patient: Mariah Bell  Procedure(s) Performed: Procedure(s): RIGHT TOTAL HIP ARTHROPLASTY ANTERIOR APPROACH (Right)  Patient Location: PACU  Anesthesia Type:Spinal  Level of Consciousness:  sedated, patient cooperative and responds to stimulation  Airway & Oxygen Therapy:Patient Spontanous Breathing and Patient connected to face mask oxgen  Post-op Assessment:  Report given to PACU RN and Post -op Vital signs reviewed and stable  Post vital signs:  Reviewed and stable  Last Vitals:  Filed Vitals:   10/09/15 1012  BP: 121/89  Pulse: 90  Temp: 36.3 C  Resp: 20    Complications: No apparent anesthesia complications

## 2015-10-09 NOTE — Brief Op Note (Signed)
10/09/2015  2:00 PM  PATIENT:  Mariah Bell  39 y.o. female  PRE-OPERATIVE DIAGNOSIS:  avascular necrosis right hip  POST-OPERATIVE DIAGNOSIS:  avascular necrosis right hip  PROCEDURE:  Procedure(s): RIGHT TOTAL HIP ARTHROPLASTY ANTERIOR APPROACH (Right)  SURGEON:  Surgeon(s) and Role:    * Kathryne Hitchhristopher Y Haruko Mersch, MD - Primary  PHYSICIAN ASSISTANT: Rexene EdisonGil Clark, PA-C  ANESTHESIA:   spinal  EBL:  Total I/O In: 2000 [I.V.:2000] Out: 400 [Urine:200; Blood:200]  BLOOD ADMINISTERED:none  DRAINS: none   LOCAL MEDICATIONS USED:  NONE  SPECIMEN:  No Specimen  DISPOSITION OF SPECIMEN:  N/A  COUNTS:  YES  TOURNIQUET:  * No tourniquets in log *  DICTATION: .Other Dictation: Dictation Number [id number]  PLAN OF CARE: Admit to inpatient   PATIENT DISPOSITION:  PACU - hemodynamically stable.   Delay start of Pharmacological VTE agent (>24hrs) due to surgical blood loss or risk of bleeding: no

## 2015-10-09 NOTE — H&P (Signed)
TOTAL HIP ADMISSION H&P  Patient is admitted for right total hip arthroplasty.  Subjective:  Chief Complaint: right hip pain  HPI: Mariah Bell, 39 y.o. female, has a history of pain and functional disability in the right hip(s) due to idiopathic avascular necrosis and patient has failed non-surgical conservative treatments for greater than 12 weeks to include NSAID's and/or analgesics, corticosteriod injections, flexibility and strengthening excercises, weight reduction as appropriate and activity modification.  Onset of symptoms was gradual starting 3 years ago with rapidlly worsening course since that time.The patient noted no past surgery on the right hip(s).  Patient currently rates pain in the right hip at 10 out of 10 with activity. Patient has night pain, worsening of pain with activity and weight bearing, trendelenberg gait, pain that interfers with activities of daily living and pain with passive range of motion. Patient has evidence of subchondral cysts, subchondral sclerosis, periarticular osteophytes and joint space narrowing by imaging studies. This condition presents safety issues increasing the risk of falls.  There is no current active infection.  Patient Active Problem List   Diagnosis Date Noted  . Avascular necrosis of bone of right hip (HCC) 10/09/2015   Past Medical History  Diagnosis Date  . Hypertension   . Asthma   . Arthritis     Past Surgical History  Procedure Laterality Date  . Tonsillectomy    . Tubal ligation      No prescriptions prior to admission   No Known Allergies  Social History  Substance Use Topics  . Smoking status: Current Every Day Smoker -- 0.50 packs/day    Types: Cigarettes  . Smokeless tobacco: Never Used  . Alcohol Use: No    Family History  Problem Relation Age of Onset  . Diabetes Mother      Review of Systems  Musculoskeletal: Positive for joint pain.  All other systems reviewed and are  negative.   Objective:  Physical Exam  Constitutional: She is oriented to person, place, and time. She appears well-developed and well-nourished.  HENT:  Head: Normocephalic and atraumatic.  Eyes: EOM are normal. Pupils are equal, round, and reactive to light.  Neck: Normal range of motion. Neck supple.  Cardiovascular: Normal rate and regular rhythm.   Respiratory: Effort normal and breath sounds normal.  GI: Soft. Bowel sounds are normal.  Musculoskeletal:       Right hip: She exhibits decreased range of motion, decreased strength, tenderness and bony tenderness.  Neurological: She is alert and oriented to person, place, and time.  Skin: Skin is warm and dry.  Psychiatric: She has a normal mood and affect.    Vital signs in last 24 hours:    Labs:   Estimated body mass index is 28.34 kg/(m^2) as calculated from the following:   Height as of 07/24/15:  (1.753 m).   Weight as of 07/24/15: 87.091 kg (192 lb).   Imaging Review Plain radiographs demonstrate end-stage avascular necrosis of the right hip  Assessment/Plan:  End stage idiopathic AVN, right hip(s)  The patient history, physical examination, clinical judgement of the provider and imaging studies are consistent with end stage AVN of the right hip(s) and total hip arthroplasty is deemed medically necessary. The treatment options including medical management, injection therapy, arthroscopy and arthroplasty were discussed at length. The risks and benefits of total hip arthroplasty were presented and reviewed. The risks due to aseptic loosening, infection, stiffness, dislocation/subluxation,  thromboembolic complications and other imponderables were discussed.  The patient acknowledged  the explanation, agreed to proceed with the plan and consent was signed. Patient is being admitted for inpatient treatment for surgery, pain control, PT, OT, prophylactic antibiotics, VTE prophylaxis, progressive ambulation and ADL's and  discharge planning.The patient is planning to be discharged home with home health services

## 2015-10-09 NOTE — Anesthesia Postprocedure Evaluation (Signed)
Anesthesia Post Note  Patient: Mariah Bell  Procedure(s) Performed: Procedure(s) (LRB): RIGHT TOTAL HIP ARTHROPLASTY ANTERIOR APPROACH (Right)  Patient location during evaluation: PACU Anesthesia Type: Spinal Level of consciousness: oriented and awake and alert Pain management: pain level controlled Vital Signs Assessment: post-procedure vital signs reviewed and stable Respiratory status: spontaneous breathing, respiratory function stable and patient connected to nasal cannula oxygen Cardiovascular status: blood pressure returned to baseline and stable Postop Assessment: No backache and No signs of nausea or vomiting Anesthetic complications: no    Last Vitals:  Filed Vitals:   10/09/15 1534 10/09/15 1545  BP:  127/84  Pulse:  81  Temp: 36.3 C 36.5 C  Resp:  16    Last Pain:  Filed Vitals:   10/09/15 1547  PainSc: 0-No pain    LLE Motor Response: Purposeful movement (toes) LLE Sensation: Decreased RLE Motor Response: Purposeful movement (toes) RLE Sensation: Decreased L Sensory Level: S1-Sole of foot, small toes R Sensory Level: S1-Sole of foot, small toes  Venus Gilles J

## 2015-10-10 LAB — BASIC METABOLIC PANEL
Anion gap: 9 (ref 5–15)
BUN: 7 mg/dL (ref 6–20)
CALCIUM: 8.7 mg/dL — AB (ref 8.9–10.3)
CO2: 24 mmol/L (ref 22–32)
Chloride: 102 mmol/L (ref 101–111)
Creatinine, Ser: 0.86 mg/dL (ref 0.44–1.00)
GFR calc Af Amer: >60 mL/min (ref >60)
GLUCOSE: 128 mg/dL — AB (ref 65–99)
POTASSIUM: 3.7 mmol/L (ref 3.5–5.1)
Sodium: 135 mmol/L (ref 135–145)

## 2015-10-10 LAB — CBC
HCT: 36.7 % (ref 36.0–46.0)
Hemoglobin: 12.4 g/dL (ref 12.0–15.0)
MCH: 31.6 pg (ref 26.0–34.0)
MCHC: 33.8 g/dL (ref 30.0–36.0)
MCV: 93.4 fL (ref 78.0–100.0)
PLATELETS: 290 10*3/uL (ref 150–400)
RBC: 3.93 MIL/uL (ref 3.87–5.11)
RDW: 12.5 % (ref 11.5–15.5)
WBC: 13.7 10*3/uL — ABNORMAL HIGH (ref 4.0–10.5)

## 2015-10-10 MED ORDER — LISINOPRIL 20 MG PO TABS
20.0000 mg | ORAL_TABLET | Freq: Every day | ORAL | Status: DC
  Administered 2015-10-10 – 2015-10-12 (×3): 20 mg via ORAL
  Filled 2015-10-10 (×3): qty 1

## 2015-10-10 NOTE — Care Management Note (Signed)
Case Management Note  Patient Details  Name: Mariah Bell MRN: [id number] Date of Birth: Oct 03, 1976  Subjective/Objective:                  RIGHT TOTAL HIP ARTHROPLASTY  Action/Plan: CM spoke with patient at the bedside. Patient's [facility] She agrees for [facility] to provide HHPT. Patient needs a RW and 3N1. [name]. Patient has assistance at home at discharge.   Expected Discharge Date:                  Expected Discharge Plan:  Home w Home Health Services  In-House Referral:  NA  Discharge planning Services  CM Consult  Post Acute Care Choice:  Home Health Choice offered to:  Patient  DME Arranged:  3-N-1, Walker rolling DME Agency:  [facility]  HH Arranged:  PT [facility]  Status of Service:  Completed, signed off  Medicare Important Message Given:    Date Medicare IM Given:    Medicare IM give by:    Date Additional Medicare IM Given:    Additional Medicare Important Message give by:     If discussed at Long Length of Stay Meetings, dates discussed:    Additional Comments:  Antony HasteBennett, [place] Osten Harris, RN 10/10/2015, 9:51 AM

## 2015-10-10 NOTE — Evaluation (Signed)
Occupational Therapy Evaluation Patient Details Name: Mariah Bell MRN: [id number] DOB: 03-11-1976 Today's Date: 10/10/2015    History of Present Illness L THR   Clinical Impression   This 39 year old female was admitted for the above surgery. She will benefit from skilled OT to increase safety and independence with adls.  Pt was independent prior to admission and she now needs up to max A.  Goals in acute are for min guard level.    Follow Up Recommendations  Supervision/Assistance - 24 hour    Equipment Recommendations  3 in 1 bedside comode;Tub/shower bench (if covered by insurance)    Recommendations for Other Services       Precautions / Restrictions Precautions Precautions: Fall Restrictions Weight Bearing Restrictions: No Other Position/Activity Restrictions: WBAT      Mobility Bed Mobility               General bed mobility comments: oob  Transfers Overall transfer level: Needs assistance Equipment used: Rolling walker (2 wheeled) Transfers: Sit to/from Stand Sit to Stand: Min assist;Mod assist;+2 safety/equipment;From elevated surface         General transfer comment: min/mod A with PT:  did not perform in OT this session due to pain    Balance Overall balance assessment: Needs assistance Sitting-balance support: Feet supported Sitting balance-Leahy Scale: Good     Standing balance support: Bilateral upper extremity supported Standing balance-Leahy Scale: Fair                              ADL Overall ADL's : Needs assistance/impaired     Grooming: Set up;Standing   Upper Body Bathing: Set up;Sitting   Lower Body Bathing: Moderate assistance;Sit to/from stand   Upper Body Dressing : Set up;Sitting   Lower Body Dressing: Moderate assistance;With adaptive equipment;Sit to/from stand                 General ADL Comments: Pt seen in recliner:  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  Had pt  practice on non-surgical leg with sock aide and reacher to don/doff sock. Pt states she can move RLE but unable to during evaluation due to increased pain.  Issued kit and will plan to have OT return tomorrow to work on this with her.  She states that a tub bench would fit in her bathroom, if insurance will cover it.       Vision     Perception     Praxis      Pertinent Vitals/Pain Pain Assessment: 0-10 Pain Score: 8  Pain Location: R hip/thigh Pain Descriptors / Indicators: Aching Pain Intervention(s): Limited activity within patient's tolerance;Monitored during session;Premedicated before session;Repositioned;Ice applied     Hand Dominance     Extremity/Trunk Assessment Upper Extremity Assessment Upper Extremity Assessment: Overall WFL for tasks assessed      Cervical / Trunk Assessment Cervical / Trunk Assessment: Normal   Communication Communication Communication: No difficulties   Cognition Arousal/Alertness: Awake/alert Behavior During Therapy: WFL for tasks assessed/performed Overall Cognitive Status: Within Functional Limits for tasks assessed                     General Comments       Exercises       Shoulder Instructions      Home Living Family/patient expects to be discharged to:: Private residence Living Arrangements: Parent Available Help at Discharge: Family Type  of Home: House Home Access: Ramped entrance     Home Layout: One level     Bathroom Shower/Tub: Tub/shower unit Shower/tub characteristics: Architectural technologist: Standard     Home Equipment: None          Prior Functioning/Environment Level of Independence: Independent             OT Diagnosis: Acute pain   OT Problem List: Decreased strength;Decreased activity tolerance;Decreased knowledge of use of DME or AE;Pain   OT Treatment/Interventions: Self-care/ADL training;DME and/or AE instruction;Patient/family education    OT Goals(Current goals can be  found in the care plan section) Acute Rehab OT Goals Patient Stated Goal: be as independent as possible with decreased pain OT Goal Formulation: With patient Time For Goal Achievement: 10/17/15 Potential to Achieve Goals: Good ADL Goals Pt Will Perform Grooming: with supervision;standing Pt Will Perform Lower Body Bathing: with min guard assist;with adaptive equipment;sit to/from stand Pt Will Perform Lower Body Dressing: with min guard assist;with adaptive equipment;sit to/from stand Pt Will Transfer to Toilet: with min guard assist;ambulating;bedside commode Additional ADL Goal #1: pt will perform tub bench transfer with min A vs verbalizing sequence  OT Frequency: Min 2X/week   Barriers to D/C:            Co-evaluation              End of Session    Activity Tolerance: Patient limited by pain Patient left: in chair;with call bell/phone within reach   Time: 1059-1115 OT Time Calculation (min): 16 min Charges:  OT General Charges $OT Visit: 1 Procedure OT Evaluation $Initial OT Evaluation Tier I: 1 Procedure G-Codes:    Adithya Difrancesco 10-29-15, 11:56 AM  Lesle Chris, OTR/L [phone] 10/29/15

## 2015-10-10 NOTE — Progress Notes (Signed)
Subjective: 1 Day Post-Op Procedure(s) (LRB): RIGHT TOTAL HIP ARTHROPLASTY ANTERIOR APPROACH (Right) Patient reports pain as moderate and severe.    Objective: Vital signs in last 24 hours: Temp:  [94.5 F (34.7 Bell)-98.4 F (36.9 Bell)] 97.7 F (36.5 Bell) (11/26 0502) Pulse Rate:  [66-90] 81 (11/26 0502) Resp:  [14-25] 14 (11/26 0502) BP: (83-158)/(57-128) 158/95 mmHg (11/26 0502) SpO2:  [98 %-100 %] 100 % (11/26 0502) Weight:  [88.536 kg (195 lb 3 oz)] 88.536 kg (195 lb 3 oz) (11/25 1012)  Intake/Output from previous day: 11/25 0701 - 11/26 0700 In: 4137.1 [P.O.:240; I.V.:3747.1; IV Piggyback:150] Out: 3550 [Urine:3350; Blood:200] Intake/Output this shift:     Recent Labs  10/10/15 0450  HGB 12.4    Recent Labs  10/10/15 0450  WBC 13.7*  RBC 3.93  HCT 36.7  PLT 290    Recent Labs  10/10/15 0450  NA 135  K 3.7  CL 102  CO2 24  BUN 7  CREATININE 0.86  GLUCOSE 128*  CALCIUM 8.7*   No results for input(s): LABPT, INR in the last 72 hours.  Neurologically intact, dressing dry.   Assessment/Plan: 1 Day Post-Op Procedure(s) (LRB): RIGHT TOTAL HIP ARTHROPLASTY ANTERIOR APPROACH (Right) Up with therapy  Mariah Bell 10/10/2015, 8:19 AM

## 2015-10-10 NOTE — Evaluation (Signed)
Physical Therapy Evaluation Patient Details Name: Mariah PickerelMelissa R Harbeson MRN: [id number] DOB: 10-20-1976 Today's Date: 10/10/2015   History of Present Illness  L THR  Clinical Impression  Pt s/p L THR presents with decreased L LE strength/ROM and post op pain limiting functional mobility.  Pt should progress to dc home with family assist and HHPT follow up.    Follow Up Recommendations Home health PT    Equipment Recommendations  Rolling walker with 5" wheels    Recommendations for Other Services OT consult     Precautions / Restrictions Precautions Precautions: Fall Restrictions Weight Bearing Restrictions: No Other Position/Activity Restrictions: WBAT      Mobility  Bed Mobility               General bed mobility comments: Pt found sitting at EOB - assisted up by nursing  Transfers Overall transfer level: Needs assistance Equipment used: Rolling walker (2 wheeled) Transfers: Sit to/from Stand Sit to Stand: Min assist;Mod assist;+2 safety/equipment;From elevated surface         General transfer comment: cues for LE management and use of UEs to self assist  Ambulation/Gait Ambulation/Gait assistance: Min assist;Mod assist;+2 physical assistance;+2 safety/equipment Ambulation Distance (Feet): 14 Feet Assistive device: Rolling walker (2 wheeled) Gait Pattern/deviations: Step-to pattern;Decreased step length - right;Decreased step length - left;Shuffle;Trunk flexed;Narrow base of support Gait velocity: decreased Gait velocity interpretation: Below normal speed for age/gender General Gait Details: Cues for sequence, posture and position from RW.  Physical assist to advance R LE  Stairs            Wheelchair Mobility    Modified Rankin (Stroke Patients Only)       Balance Overall balance assessment: Needs assistance Sitting-balance support: Feet supported Sitting balance-Leahy Scale: Good     Standing balance support: Bilateral upper extremity  supported Standing balance-Leahy Scale: Fair                               Pertinent Vitals/Pain Pain Assessment: 0-10 Pain Score: 8  Pain Location: R hip/thigh Pain Descriptors / Indicators: Aching;Sore Pain Intervention(s): Limited activity within patient's tolerance;Monitored during session;Premedicated before session;Ice applied    Home Living Family/patient expects to be discharged to:: Private residence Living Arrangements: Parent Available Help at Discharge: Family Type of Home: House Home Access: Ramped entrance     Home Layout: One level Home Equipment: None      Prior Function Level of Independence: Independent               Hand Dominance        Extremity/Trunk Assessment   Upper Extremity Assessment: Overall WFL for tasks assessed           Lower Extremity Assessment: RLE deficits/detail      Cervical / Trunk Assessment: Normal  Communication   Communication: No difficulties  Cognition Arousal/Alertness: Awake/alert Behavior During Therapy: WFL for tasks assessed/performed Overall Cognitive Status: Within Functional Limits for tasks assessed                      General Comments      Exercises        Assessment/Plan    PT Assessment Patient needs continued PT services  PT Diagnosis Difficulty walking   PT Problem List Decreased strength;Decreased range of motion;Decreased activity tolerance;Decreased mobility;Pain;Decreased knowledge of use of DME  PT Treatment Interventions DME instruction;Gait training;Stair training;Functional mobility training;Therapeutic exercise;Therapeutic activities;Patient/family education  PT Goals (Current goals can be found in the Care Plan section) Acute Rehab PT Goals Patient Stated Goal: Walk with less pain PT Goal Formulation: With patient Time For Goal Achievement: 10/17/15 Potential to Achieve Goals: Good    Frequency 7X/week   Barriers to discharge         Co-evaluation               End of Session Equipment Utilized During Treatment: Gait belt Activity Tolerance: Patient limited by pain;Patient limited by fatigue Patient left: in chair;with call bell/phone within reach Nurse Communication: Mobility status         Time: 4098-1191 PT Time Calculation (min) (ACUTE ONLY): 27 min   Charges:   PT Evaluation $Initial PT Evaluation Tier I: 1 Procedure PT Treatments $Gait Training: 8-22 mins   PT G Codes:        Lasharon Dunivan 10-29-2015, 11:10 AM

## 2015-10-10 NOTE — Progress Notes (Signed)
Physical Therapy Treatment Patient Details Name: Mariah PickerelMelissa R Hearld MRN: [id number] DOB: 1976-05-08 Today's Date: 10/10/2015    History of Present Illness L THR    PT Comments    Pt cooperative but pain ltd and requiring increased time and cues for all tasks.  Pt expressing concern that hip might be dislocated - LEs appear same length, in neutral rotation and pt able to WB - explained same to pt and pt seemed to accept.  Follow Up Recommendations  Home health PT     Equipment Recommendations  Rolling walker with 5" wheels    Recommendations for Other Services OT consult     Precautions / Restrictions Precautions Precautions: Fall Restrictions Weight Bearing Restrictions: No Other Position/Activity Restrictions: WBAT    Mobility  Bed Mobility Overal bed mobility: Needs Assistance Bed Mobility: Sit to Supine       Sit to supine: Min assist;Mod assist;+2 for physical assistance;+2 for safety/equipment   General bed mobility comments: cues for sequence and use of L LE to self assist  Transfers Overall transfer level: Needs assistance Equipment used: None Transfers: Sit to/from Stand Sit to Stand: Min assist;Mod assist;+2 safety/equipment;From elevated surface Stand pivot transfers: Min assist;Mod assist;+2 physical assistance;+2 safety/equipment       General transfer comment: cues for LE management and use of UEs to self assist  Ambulation/Gait Ambulation/Gait assistance: Min assist;Mod assist;+2 physical assistance;+2 safety/equipment Ambulation Distance (Feet): 4 Feet Assistive device: Rolling walker (2 wheeled) Gait Pattern/deviations: Step-to pattern;Decreased step length - right;Decreased step length - left;Shuffle;Trunk flexed Gait velocity: decreased   General Gait Details: Cues for sequence, posture and position from RW.     Stairs            Wheelchair Mobility    Modified Rankin (Stroke Patients Only)       Balance                                    Cognition Arousal/Alertness: Awake/alert Behavior During Therapy: WFL for tasks assessed/performed Overall Cognitive Status: Within Functional Limits for tasks assessed                      Exercises      General Comments        Pertinent Vitals/Pain Pain Assessment: 0-10 Pain Score: 10-Worst pain ever Pain Location: R hip/thigh Pain Descriptors / Indicators: Aching;Sore Pain Intervention(s): Limited activity within patient's tolerance;Monitored during session;Premedicated before session;Ice applied    Home Living Family/patient expects to be discharged to:: Private residence Living Arrangements: Parent Available Help at Discharge: Family Type of Home: House       Home Equipment: None      Prior Function Level of Independence: Independent          PT Goals (current goals can now be found in the care plan section) Acute Rehab PT Goals Patient Stated Goal: be as independent as possible with decreased pain PT Goal Formulation: With patient Time For Goal Achievement: 10/17/15 Potential to Achieve Goals: Good Progress towards PT goals: Progressing toward goals    Frequency  7X/week    PT Plan Current plan remains appropriate    Co-evaluation             End of Session Equipment Utilized During Treatment: Gait belt Activity Tolerance: Patient limited by pain;Patient limited by fatigue Patient left: in bed;with call bell/phone within reach;with family/visitor present  Time: 1240-1303 PT Time Calculation (min) (ACUTE ONLY): 23 min  Charges:  $Therapeutic Activity: 23-37 mins                    G Codes:      Rainey Rodger 10/26/15, 1:36 PM

## 2015-10-11 MED ORDER — ASPIRIN 325 MG PO TBEC: 325.0000 mg | DELAYED_RELEASE_TABLET | Freq: Two times a day (BID) | ORAL | Status: DC

## 2015-10-11 MED ORDER — OXYCODONE-ACETAMINOPHEN 5-325 MG PO TABS: 1.0000 | ORAL_TABLET | ORAL | Status: DC | PRN

## 2015-10-11 MED ORDER — METHOCARBAMOL 500 MG PO TABS: 500.0000 mg | ORAL_TABLET | Freq: Four times a day (QID) | ORAL | Status: DC | PRN

## 2015-10-11 MED ORDER — SALINE SPRAY 0.65 % NA SOLN
1.0000 | NASAL | Status: DC | PRN
  Administered 2015-10-11: 1 via NASAL
  Filled 2015-10-11: qty 44

## 2015-10-11 NOTE — Progress Notes (Signed)
Subjective: 2 Days Post-Op Procedure(s) (LRB): RIGHT TOTAL HIP ARTHROPLASTY ANTERIOR APPROACH (Right) Patient reports pain as mild.  Just had pain meds, opens and closes eyes, falls asleep while talking to her.   Objective: Vital signs in last 24 hours: Temp:  [97.3 F (36.3 C)-98.5 F (36.9 C)] 98.5 F (36.9 C) (11/26 2200) Pulse Rate:  [70-75] 70 (11/26 2200) Resp:  [14-15] 14 (11/26 2200) BP: (147-161)/(71-101) 147/93 mmHg (11/26 2200) SpO2:  [97 %-100 %] 97 % (11/26 2200)  Intake/Output from previous day: 11/26 0701 - 11/27 0700 In: 960 [P.O.:960] Out: 450 [Urine:450] Intake/Output this shift:     Recent Labs  10/10/15 0450  HGB 12.4    Recent Labs  10/10/15 0450  WBC 13.7*  RBC 3.93  HCT 36.7  PLT 290    Recent Labs  10/10/15 0450  NA 135  K 3.7  CL 102  CO2 24  BUN 7  CREATININE 0.86  GLUCOSE 128*  CALCIUM 8.7*   No results for input(s): LABPT, INR in the last 72 hours.  dressing dry.   Assessment/Plan: 2 Days Post-Op Procedure(s) (LRB): RIGHT TOTAL HIP ARTHROPLASTY ANTERIOR APPROACH (Right) Up with therapy  May need to cut back on pain meds.   Mariah Bell C 10/11/2015, 10:16 AM

## 2015-10-11 NOTE — Progress Notes (Signed)
Physical Therapy Treatment Patient Details Name: Mariah PickerelMelissa R Bell MRN: [id number] DOB: [birth year] Today's Date: 10/11/2015    History of Present Illness L THR    PT Comments    Pt requiring increased time and encouragement to participate in all tasks.  Follow Up Recommendations  Home health PT     Equipment Recommendations  Rolling walker with 5" wheels    Recommendations for Other Services OT consult     Precautions / Restrictions Precautions Precautions: Fall Restrictions Weight Bearing Restrictions: No Other Position/Activity Restrictions: WBAT    Mobility  Bed Mobility               General bed mobility comments: Pt at EOB with nursing  Transfers Overall transfer level: Needs assistance Equipment used: Rolling walker (2 wheeled) Transfers: Sit to/from Stand Sit to Stand: +2 safety/equipment;Min assist;Mod assist         General transfer comment: cues for LE management and use of UEs to self assist  Ambulation/Gait Ambulation/Gait assistance: Min assist;Mod assist;+2 safety/equipment Ambulation Distance (Feet): 34 Feet Assistive device: Rolling walker (2 wheeled) Gait Pattern/deviations: Step-to pattern;Decreased step length - right;Shuffle;Trunk flexed;Decreased stance time - left;Decreased step length - left Gait velocity: decreased   General Gait Details: Cues for sequence, posture and position from RW.  Assist to advance L LE with each step   Stairs            Wheelchair Mobility    Modified Rankin (Stroke Patients Only)       Balance                                    Cognition Arousal/Alertness: Awake/alert Behavior During Therapy: WFL for tasks assessed/performed Overall Cognitive Status: Within Functional Limits for tasks assessed                      Exercises      General Comments        Pertinent Vitals/Pain Pain Assessment: 0-10 Pain Score: 8  Pain Location: R hip/thigh Pain  Descriptors / Indicators: Aching;Sore Pain Intervention(s): Limited activity within patient's tolerance;Monitored during session;Premedicated before session;Ice applied    Home Living                      Prior Function            PT Goals (current goals can now be found in the care plan section) Acute Rehab PT Goals Patient Stated Goal: be as independent as possible with decreased pain PT Goal Formulation: With patient Time For Goal Achievement: 10/17/15 Potential to Achieve Goals: Good Progress towards PT goals: Progressing toward goals    Frequency  7X/week    PT Plan Current plan remains appropriate    Co-evaluation             End of Session Equipment Utilized During Treatment: Gait belt Activity Tolerance: Patient limited by pain;Patient limited by fatigue Patient left: in chair;with call bell/phone within reach;with family/visitor present     Time: 1047-1110 PT Time Calculation (min) (ACUTE ONLY): 23 min  Charges:  $Gait Training: 23-37 mins                    G Codes:      Mariah Bell 10/11/2015, 1:01 PM

## 2015-10-11 NOTE — Progress Notes (Signed)
Physical Therapy Treatment Patient Details Name: Mariah PickerelMelissa R Bell MRN: [id number] DOB: 1976/11/07 Today's Date: 10/11/2015    History of Present Illness L THR    PT Comments    Pt progressing slowly with mobility.  Continues to require assistance to advance R LE with gait.    Follow Up Recommendations  Home health PT     Equipment Recommendations  Rolling walker with 5" wheels    Recommendations for Other Services OT consult     Precautions / Restrictions Precautions Precautions: Fall Restrictions Weight Bearing Restrictions: No Other Position/Activity Restrictions: WBAT    Mobility  Bed Mobility Overal bed mobility: Needs Assistance Bed Mobility: Sit to Supine       Sit to supine: Min assist   General bed mobility comments: Cues for sequence and use of L LE to self assist  Transfers Overall transfer level: Needs assistance Equipment used: Rolling walker (2 wheeled) Transfers: Sit to/from Stand Sit to Stand: Min assist;+2 safety/equipment         General transfer comment: cues for LE management and use of UEs to self assist  Ambulation/Gait Ambulation/Gait assistance: Min assist Ambulation Distance (Feet): 25 Feet Assistive device: Rolling walker (2 wheeled) Gait Pattern/deviations: Step-to pattern;Decreased step length - right;Decreased step length - left;Shuffle;Trunk flexed Gait velocity: decreased   General Gait Details: Cues for sequence, posture and position from RW.  Assist to advance L LE with each step   Stairs            Wheelchair Mobility    Modified Rankin (Stroke Patients Only)       Balance                                    Cognition Arousal/Alertness: Awake/alert Behavior During Therapy: WFL for tasks assessed/performed Overall Cognitive Status: Within Functional Limits for tasks assessed                      Exercises Total Joint Exercises Ankle Circles/Pumps: AROM;Both;15  reps;Supine Quad Sets: AROM;Both;10 reps;Supine Heel Slides: AAROM;Right;20 reps;Supine Hip ABduction/ADduction: AAROM;Right;15 reps;Supine    General Comments        Pertinent Vitals/Pain Pain Assessment: 0-10 Pain Score: 7  Pain Location: R hip/thigh Pain Descriptors / Indicators: Aching;Sore Pain Intervention(s): Premedicated before session;Monitored during session;Limited activity within patient's tolerance;Ice applied    Home Living                      Prior Function            PT Goals (current goals can now be found in the care plan section) Acute Rehab PT Goals Patient Stated Goal: be as independent as possible with decreased pain PT Goal Formulation: With patient Time For Goal Achievement: 10/17/15 Potential to Achieve Goals: Good Progress towards PT goals: Progressing toward goals    Frequency  7X/week    PT Plan Current plan remains appropriate    Co-evaluation             End of Session Equipment Utilized During Treatment: Gait belt Activity Tolerance: Patient limited by pain;Patient limited by fatigue Patient left: in bed;with call Bell/phone within reach;with family/visitor present     Time: [id number] PT Time Calculation (min) (ACUTE ONLY): 25 min  Charges:  $Gait Training: 8-22 mins $Therapeutic Exercise: 8-22 mins  G Codes:      Mariah Bell 11-07-2015, 4:31 PM

## 2015-10-12 ENCOUNTER — Encounter (HOSPITAL_COMMUNITY): Payer: Self-pay | Admitting: Orthopaedic Surgery

## 2015-10-12 NOTE — Progress Notes (Signed)
Occupational Therapy Treatment Patient Details Name: Mariah PickerelMelissa R Bell MRN: [id number] DOB: 15-Oct-1976 Today's Date: 10/12/2015    History of present illness L THR   OT comments  Pt ready to DC  With A from family  Follow Up Recommendations  No OT follow up    Equipment Recommendations  3 in 1 bedside comode;Tub/shower bench    Recommendations for Other Services      Precautions / Restrictions Precautions Precautions: Fall Restrictions Weight Bearing Restrictions: No Other Position/Activity Restrictions: WBAT       Mobility Bed Mobility               General bed mobility comments: pt out of bed  Transfers Overall transfer level: Needs assistance Equipment used: Rolling walker (2 wheeled) Transfers: Sit to/from Stand Sit to Stand: Min assist Stand pivot transfers: Min assist                ADL Overall ADL's : Needs assistance/impaired     Grooming: Set up;Standing       Lower Body Bathing: Minimal assistance;Sit to/from stand;Cueing for sequencing;Cueing for safety       Lower Body Dressing: Minimal assistance;Sit to/from stand;Cueing for sequencing;Cueing for safety   Toilet Transfer: Min guard;Comfort height toilet;Ambulation;RW   Toileting- ArchitectClothing Manipulation and Hygiene: Min guard;Sit to/from stand;Cueing for sequencing;Cueing for safety                          Cognition   Behavior During Therapy: [facility] for tasks assessed/performed Overall Cognitive Status: Within Functional Limits for tasks assessed                       Extremity/Trunk Assessment                          Pertinent Vitals/ Pain       Pain Score: 4  Pain Location: r hip Pain Descriptors / Indicators: Sore Pain Intervention(s): Monitored during session     Prior Functioning/Environment              Frequency       Progress Toward Goals  OT Goals(current goals can now be found in the care plan section)  Progress towards  OT goals: Progressing toward goals  Acute Rehab OT Goals Patient Stated Goal: be as independent as possible with decreased pain  Plan Discharge plan remains appropriate       End of Session     Activity Tolerance Patient tolerated treatment well   Patient Left in chair;with call Bell/phone within reach;with family/visitor present   Nurse Communication Mobility status        Time: 1003-1020 OT Time Calculation (min): 17 min  Charges: OT General Charges $OT Visit: 1 Procedure OT Treatments $Self Care/Home Management : 8-22 mins  Mariah Bell D 10/12/2015, 10:46 AM

## 2015-10-12 NOTE — Progress Notes (Signed)
Subjective: 3 Days Post-Op Procedure(s) (LRB): RIGHT TOTAL HIP ARTHROPLASTY ANTERIOR APPROACH (Right) Patient reports pain as moderate.    Objective: Vital signs in last 24 hours: Temp:  [97.8 F (36.6 C)-99.6 F (37.6 C)] 99 F (37.2 C) (11/28 0530) Pulse Rate:  [82-91] 82 (11/28 0530) Resp:  [14-16] 16 (11/28 0530) BP: (120-149)/(81-98) 120/81 mmHg (11/28 0530) SpO2:  [100 %] 100 % (11/28 0530)  Intake/Output from previous day: 11/27 0701 - 11/28 0700 In: 1200 [P.O.:1200] Out: 0  Intake/Output this shift:     Recent Labs  10/10/15 0450  HGB 12.4    Recent Labs  10/10/15 0450  WBC 13.7*  RBC 3.93  HCT 36.7  PLT 290    Recent Labs  10/10/15 0450  NA 135  K 3.7  CL 102  CO2 24  BUN 7  CREATININE 0.86  GLUCOSE 128*  CALCIUM 8.7*   No results for input(s): LABPT, INR in the last 72 hours.  Sensation intact distally Intact pulses distally Dorsiflexion/Plantar flexion intact Incision: dressing C/D/I  Assessment/Plan: 3 Days Post-Op Procedure(s) (LRB): RIGHT TOTAL HIP ARTHROPLASTY ANTERIOR APPROACH (Right) Discharge home with home health today   Kathryne HitchBLACKMAN,CHRISTOPHER Y 10/12/2015, 7:14 AM

## 2015-10-12 NOTE — Discharge Instructions (Signed)

## 2015-10-12 NOTE — Progress Notes (Signed)
Pt to d/[facility]. DME (3n1 and RW) delivered to patient's room prior to d/c. Patient's pain assessed and she was medicated prior to d/c. Prescriptions put in patient's hand. AVS reviewed and "My Chart" discussed with pt. Pt capable of verbalizing medications, signs and symptoms of infection, and follow-up appointments. Remains hemodynamically stable. No signs and symptoms of distress. Educated pt to return to ER in the case of SOB, dizziness, or chest pain.

## 2015-10-12 NOTE — Progress Notes (Signed)
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  Discussed pt ambulating backwards up ramp if having difficulty ambulating forwards at incline, and spouse reports he will be assisting pt home.  Pt had no further questions and plans to d/c home today.   Follow Up Recommendations  Home health PT     Equipment Recommendations  Rolling walker with 5" wheels    Recommendations for Other Services       Precautions / Restrictions Precautions Precautions: Fall Restrictions Weight Bearing Restrictions: No Other Position/Activity Restrictions: WBAT    Mobility  Bed Mobility               General bed mobility comments: pt out of bed  Transfers Overall transfer level: Needs assistance Equipment used: Rolling walker (2 wheeled) Transfers: Sit to/from Stand Sit to Stand: Min guard Stand pivot transfers: Min assist       General transfer comment: pt performed with safe technique, no physical assist required  Ambulation/Gait Ambulation/Gait assistance: Min assist;Min guard Ambulation Distance (Feet): 100 Feet Assistive device: Rolling walker (2 wheeled) Gait Pattern/deviations: Step-to pattern;Antalgic Gait velocity: decreased   General Gait Details: assist initially to advance R LE, pt with variable need for R LE assist however was able to self assist using UE, short steps, leading with L LE (no physical assist for steadying)   Stairs            Wheelchair Mobility    Modified Rankin (Stroke Patients Only)       Balance                                    Cognition Arousal/Alertness: Awake/alert Behavior During Therapy: WFL for tasks  assessed/performed Overall Cognitive Status: Within Functional Limits for tasks assessed                      Exercises      General Comments        Pertinent Vitals/Pain Pain Assessment: 0-10 Pain Score: 3  Pain Location: R hip Pain Descriptors / Indicators: Sore Pain Intervention(s): Limited activity within patient's tolerance;Monitored during session    Home Living                      Prior Function            PT Goals (current goals can now be found in the care plan section) Acute Rehab PT Goals Patient Stated Goal: be as independent as possible with decreased pain Progress towards PT goals: Progressing toward goals    Frequency  7X/week    PT Plan Current plan remains appropriate    Co-evaluation             End of Session   Activity Tolerance: Patient tolerated treatment well Patient left: with family/visitor present;Other (comment) (in bathroom, spouse in room, nsg aware)     Time: 1040-1051 PT Time Calculation (min) (ACUTE ONLY): 11 min  Charges:  $Gait Training: 8-22 mins  G Codes:      Vannesa Abair,KATHrine E 10/26/2015, 12:50 PM Zenovia Jarred, PT, DPT 10-26-2015 Pager: [phone]

## 2015-10-12 NOTE — Discharge Summary (Signed)
Patient ID: Mariah Bell MRN: [id number] DOB/AGE: 05-05-1976 39 y.o.  Admit date: 10/09/2015 Discharge date: 10/12/2015  Admission Diagnoses:  Principal Problem:   Avascular necrosis of bone of right hip [facility](HCC) Active Problems:   Status post total replacement of right hip   Discharge Diagnoses:  Same  Past Medical History  Diagnosis Date  . Hypertension   . Asthma   . Arthritis     Surgeries: Procedure(s): RIGHT TOTAL HIP ARTHROPLASTY ANTERIOR APPROACH on 10/09/2015   Consultants:    Discharged Condition: Improved  Hospital Course: Mariah PickerelMelissa R Sapp is an 39 y.o. female who was admitted 10/09/2015 for operative treatment ofAvascular necrosis of bone of right hip (HCC). Patient has severe unremitting pain that affects sleep, daily activities, and work/hobbies. After pre-op clearance the patient was taken to the operating room on 10/09/2015 and underwent  Procedure(s): RIGHT TOTAL HIP ARTHROPLASTY ANTERIOR APPROACH.    Patient was given perioperative antibiotics: Anti-infectives    Start     Dose/Rate Route Frequency Ordered Stop   10/09/15 1700  ceFAZolin (ANCEF) IVPB 1 g/50 mL premix     1 g 100 mL/hr over 30 Minutes Intravenous Every 6 hours 10/09/15 1603 10/09/15 2134   10/09/15 1011  ceFAZolin (ANCEF) IVPB 2 g/50 mL premix     2 g 100 mL/hr over 30 Minutes Intravenous On call to O.R. 10/09/15 1011 10/09/15 1230       Patient was given sequential compression devices, early ambulation, and chemoprophylaxis to prevent DVT.  Patient benefited maximally from hospital stay and there were no complications.    Recent vital signs: Patient Vitals for the past 24 hrs:  BP Temp Temp src Pulse Resp SpO2  10/12/15 0530 120/81 mmHg 99 F (37.2 C) Oral 82 16 100 %  10/11/15 2133 (!) 135/98 mmHg 99.6 F (37.6 C) Oral 86 16 100 %  10/11/15 1812 (!) 149/94 mmHg 97.8 F (36.6 C) Oral 91 14 100 %     Recent laboratory studies:  Recent Labs  10/10/15 0450  WBC 13.7*   HGB 12.4  HCT 36.7  PLT 290  NA 135  K 3.7  CL 102  CO2 24  BUN 7  CREATININE 0.86  GLUCOSE 128*  CALCIUM 8.7*     Discharge Medications:     Medication List    STOP taking these medications        acetaminophen-codeine 300-30 MG tablet  Commonly known as:  TYLENOL #3     traMADol 50 MG tablet  Commonly known as:  ULTRAM      TAKE these medications        albuterol 108 (90 BASE) MCG/ACT inhaler  Commonly known as:  PROVENTIL HFA;VENTOLIN HFA  Inhale 2 puffs into the lungs every 6 (six) hours as needed for wheezing or shortness of breath.     aspirin 325 MG EC tablet  Take 1 tablet (325 mg total) by mouth 2 (two) times daily after a meal.     cyclobenzaprine 10 MG tablet  Commonly known as:  FLEXERIL  Take 10 mg by mouth at bedtime.     lisinopril-hydrochlorothiazide 20-12.5 MG tablet  Commonly known as:  PRINZIDE,ZESTORETIC  Take 1 tablet by mouth daily.     meloxicam 15 MG tablet  Commonly known as:  MOBIC  Take 1 tablet (15 mg total) by mouth daily.     methocarbamol 500 MG tablet  Commonly known as:  ROBAXIN  Take 1 tablet (500 mg total) by mouth every 6 (  six) hours as needed for muscle spasms.     oxyCODONE-acetaminophen 5-325 MG tablet  Commonly known as:  ROXICET  Take 1-2 tablets by mouth every 4 (four) hours as needed for severe pain.     zolpidem 10 MG tablet  Commonly known as:  AMBIEN  Take 10 mg by mouth at bedtime as needed.        Diagnostic Studies: Dg Foot Complete Right  10-19-15  CLINICAL DATA:  39 year old female with pain in the right foot third through fifth toes after a coffee table fell onto the right foot yesterday. EXAM: RIGHT FOOT COMPLETE - 3+ VIEW COMPARISON:  No priors. FINDINGS: Multiple views of the right foot demonstrate no acute displaced fracture, subluxation, dislocation, or soft tissue abnormality. Pes planus. IMPRESSION: No acute radiographic abnormality of the right foot. Electronically Signed   By: Trudie Reed M.D.   On: 10-19-15 08:15   Dg C-arm 1-60 Min-no Report  10/09/2015  CLINICAL DATA: surgery C-ARM 1-60 MINUTES Fluoroscopy was utilized by the requesting physician.  No radiographic interpretation.   Dg Hip Port Unilat With Pelvis 1v Right  10/09/2015  CLINICAL DATA:  Status post right hip replacement. EXAM: DG HIP (WITH OR WITHOUT PELVIS) 1V PORT RIGHT COMPARISON:  07/24/2015 FINDINGS: There has been an interval 3 component right hip arthroplasty with screwed in acetabular component and long stem femoral component. The alignment is near anatomic. There is no evidence of immediate complications. Expected postsurgical soft tissue emphysema is noted. IMPRESSION: Status post total right hip arthroplasty without evidence of immediate complications. Electronically Signed   By: Ted Mcalpine M.D.   On: 10/09/2015 15:20   Dg Hip Operative Unilat With Pelvis Right  10/09/2015  CLINICAL DATA:  Full right hip replacement EXAM: OPERATIVE RIGHT HIP (WITH PELVIS IF PERFORMED) 2 VIEWS TECHNIQUE: Fluoroscopic spot image(s) were submitted for interpretation post-operatively. COMPARISON:  07/24/2015 FINDINGS: Radiation Exposure Index (as provided by the fluoroscopic device): 1.22 mGy Number of Acquired Images:  2 Components of total right hip replacement in anticipated position. IMPRESSION: Anticipated postoperative appearance Electronically Signed   By: Esperanza Heir M.D.   On: 10/09/2015 14:12    Disposition: 01-Home or Self Care      Discharge Instructions    Discharge patient    Complete by:  As directed            Follow-up Information    Follow up with [facility].   Why:  home health physical therapy   Contact information:   [address] [place] [address] [phone]       Follow up with Kathryne Hitch, MD. Schedule an appointment as soon as possible for a visit in 2 weeks.   Specialty:  Orthopedic Surgery   Contact information:   [address] [place] [address] [phone]        Signed: Kathryne Hitch 10/12/2015, 7:16 AM

## 2015-10-19 ENCOUNTER — Emergency Department (HOSPITAL_COMMUNITY): Payer: Managed Care, Other (non HMO)

## 2015-10-19 ENCOUNTER — Emergency Department (HOSPITAL_COMMUNITY)
Admission: EM | Admit: 2015-10-19 | Discharge: 2015-10-19 | Disposition: A | Discharge status: Home / Self Care | Payer: Managed Care, Other (non HMO) | Attending: Emergency Medicine | Admitting: Emergency Medicine

## 2015-10-19 ENCOUNTER — Encounter (HOSPITAL_COMMUNITY): Payer: Self-pay | Admitting: Emergency Medicine

## 2015-10-19 DIAGNOSIS — F17210 Nicotine dependence, cigarettes, uncomplicated: Secondary | ICD-10-CM | POA: Insufficient documentation

## 2015-10-19 DIAGNOSIS — G8929 Other chronic pain: Secondary | ICD-10-CM | POA: Diagnosis not present

## 2015-10-19 DIAGNOSIS — J45909 Unspecified asthma, uncomplicated: Secondary | ICD-10-CM | POA: Diagnosis not present

## 2015-10-19 DIAGNOSIS — Z79899 Other long term (current) drug therapy: Secondary | ICD-10-CM | POA: Diagnosis not present

## 2015-10-19 DIAGNOSIS — Z9889 Other specified postprocedural states: Secondary | ICD-10-CM | POA: Insufficient documentation

## 2015-10-19 DIAGNOSIS — M79604 Pain in right leg: Secondary | ICD-10-CM | POA: Insufficient documentation

## 2015-10-19 DIAGNOSIS — M1990 Unspecified osteoarthritis, unspecified site: Secondary | ICD-10-CM | POA: Diagnosis not present

## 2015-10-19 DIAGNOSIS — Z7982 Long term (current) use of aspirin: Secondary | ICD-10-CM | POA: Insufficient documentation

## 2015-10-19 DIAGNOSIS — I10 Essential (primary) hypertension: Secondary | ICD-10-CM | POA: Diagnosis not present

## 2015-10-19 HISTORY — DX: Pain in unspecified hip: M25.559

## 2015-10-19 HISTORY — DX: Other chronic pain: G89.29

## 2015-10-19 HISTORY — DX: Dorsalgia, unspecified: M54.9

## 2015-10-19 LAB — CBC WITH DIFFERENTIAL/PLATELET
Basophils Absolute: 0.1 10*3/uL (ref 0.0–0.1)
Basophils Relative: 1 %
Eosinophils Absolute: 0.1 10*3/uL (ref 0.0–0.7)
Eosinophils Relative: 2 %
HCT: 34.3 % — ABNORMAL LOW (ref 36.0–46.0)
Hemoglobin: 11.6 g/dL — ABNORMAL LOW (ref 12.0–15.0)
Lymphocytes Relative: 42 %
Lymphs Abs: 3.6 10*3/uL (ref 0.7–4.0)
MCH: 31.5 pg (ref 26.0–34.0)
MCHC: 33.8 g/dL (ref 30.0–36.0)
MCV: 93.2 fL (ref 78.0–100.0)
Monocytes Absolute: 0.5 10*3/uL (ref 0.1–1.0)
Monocytes Relative: 6 %
Neutro Abs: 4.2 10*3/uL (ref 1.7–7.7)
Neutrophils Relative %: 49 %
Platelets: 454 10*3/uL — ABNORMAL HIGH (ref 150–400)
RBC: 3.68 MIL/uL — ABNORMAL LOW (ref 3.87–5.11)
RDW: 12.4 % (ref 11.5–15.5)
WBC: 8.5 10*3/uL (ref 4.0–10.5)

## 2015-10-19 LAB — COMPREHENSIVE METABOLIC PANEL
ALT: 23 U/L (ref 14–54)
ANION GAP: 9 (ref 5–15)
AST: 18 U/L (ref 15–41)
Albumin: 3.5 g/dL (ref 3.5–5.0)
Alkaline Phosphatase: 54 U/L (ref 38–126)
BILIRUBIN TOTAL: 0.4 mg/dL (ref 0.3–1.2)
BUN: 10 mg/dL (ref 6–20)
CALCIUM: 8.7 mg/dL — AB (ref 8.9–10.3)
CO2: 21 mmol/L — ABNORMAL LOW (ref 22–32)
Chloride: 103 mmol/L (ref 101–111)
Creatinine, Ser: 0.72 mg/dL (ref 0.44–1.00)
GFR calc Af Amer: >60 mL/min (ref >60)
GLUCOSE: 84 mg/dL (ref 65–99)
POTASSIUM: 3.3 mmol/L — AB (ref 3.5–5.1)
Sodium: 133 mmol/L — ABNORMAL LOW (ref 135–145)
TOTAL PROTEIN: 7.9 g/dL (ref 6.5–8.1)

## 2015-10-19 MED ORDER — DIAZEPAM 5 MG/ML IJ SOLN
2.5000 mg | Freq: Once | INTRAMUSCULAR | Status: AC
  Administered 2015-10-19: 2.5 mg via INTRAVENOUS
  Filled 2015-10-19: qty 2

## 2015-10-19 MED ORDER — DIAZEPAM 5 MG PO TABS: 5.0000 mg | ORAL_TABLET | Freq: Four times a day (QID) | ORAL | Status: DC | PRN

## 2015-10-19 MED ORDER — SODIUM CHLORIDE 0.9 % IV BOLUS (SEPSIS)
1000.0000 mL | Freq: Once | INTRAVENOUS | Status: AC
  Administered 2015-10-19: 1000 mL via INTRAVENOUS

## 2015-10-19 MED ORDER — HYDROMORPHONE HCL 1 MG/ML IJ SOLN
1.0000 mg | Freq: Once | INTRAMUSCULAR | Status: AC
  Administered 2015-10-19: 1 mg via INTRAVENOUS
  Filled 2015-10-19: qty 1

## 2015-10-19 NOTE — ED Provider Notes (Signed)
CSN: [id number]     Arrival date & time 10/19/15  1810 History   First MD Initiated Contact with Patient 10/19/15 1838     Chief Complaint  Patient presents with  . Leg Swelling    right     (Consider location/radiation/quality/duration/timing/severity/associated sxs/prior Treatment) Patient is a 39 y.o. female presenting with leg pain.  Leg Pain Location:  Leg Injury: yes   Leg location:  R upper leg Pain details:    Quality:  Aching and sharp   Radiates to:  Does not radiate   Severity:  Mild   Onset quality:  Gradual   Duration:  2 days   Timing:  Constant   Progression:  Unchanged Chronicity:  New Prior injury to area:  Yes Relieved by:  None tried Worsened by:  Nothing tried Ineffective treatments:  None tried Associated symptoms: no back pain     Past Medical History  Diagnosis Date  . Hypertension   . Asthma   . Arthritis   . Chronic hip pain   . Chronic back pain    Past Surgical History  Procedure Laterality Date  . Tonsillectomy    . Tubal ligation    . 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 Diabetes Mother    Social History  Substance Use Topics  . Smoking status: Current Every Day Smoker -- 0.50 packs/day    Types: Cigarettes  . Smokeless tobacco: Never Used  . Alcohol Use: No   OB History    Gravida Para Term Preterm AB TAB SAB Ectopic Multiple Living   Review of Systems  Musculoskeletal: Negative for back pain.  All other systems reviewed and are negative.     Allergies  Review of patient's allergies indicates no known allergies.  Home Medications   Prior to Admission medications   Medication Sig Start Date End Date Taking? Authorizing Provider  aspirin EC 325 MG EC tablet Take 1 tablet (325 mg  total) by mouth 2 (two) times daily after a meal. 10/11/15  Yes Kirtland Bouchard, PA-C  lisinopril-hydrochlorothiazide (PRINZIDE,ZESTORETIC) 20-12.5 MG per tablet Take 1 tablet by mouth daily.  01/28/14  Yes Historical Provider, MD  methocarbamol (ROBAXIN) 500 MG tablet Take 1 tablet (500 mg total) by mouth every 6 (six) hours as needed for muscle spasms. 10/11/15  Yes Kirtland Bouchard, PA-C  oxyCODONE-acetaminophen (ROXICET) 5-325 MG tablet Take 1-2 tablets by mouth every 4 (four) hours as needed for severe pain. Patient taking differently: Take 1-2 tablets by mouth every 4 (four) hours as needed for moderate pain or severe pain.  10/11/15  Yes Kirtland Bouchard, PA-C  zolpidem (AMBIEN) 10 MG tablet Take 10 mg by mouth at bedtime.  06/09/14  Yes Historical Provider, MD  albuterol (PROVENTIL HFA;VENTOLIN HFA) 108 (90 BASE) MCG/ACT inhaler Inhale 2 puffs into the lungs every 6 (six) hours as needed for wheezing or shortness of breath.    Historical Provider, MD  diazepam (VALIUM) 5 MG tablet Take 1 tablet (5 mg total) by mouth every 6 (six) hours as needed for muscle spasms (spasms). 10/19/15   Marily Memos, MD   BP 110/84 mmHg  Pulse 64  Temp(Src) 98.4 F (36.9 C) (Oral)  Resp  16  Ht 5\' 9"  (1.753 m)  Wt 197 lb (89.359 kg)  BMI 29.08 kg/m2  SpO2 100%  LMP 10/10/2015 Physical Exam  Constitutional: She is oriented to person, place, and time. She appears well-developed and well-nourished.  HENT:  Head: Normocephalic and atraumatic.  Eyes: Pupils are equal, round, and reactive to light.  Neck: Normal range of motion.  Cardiovascular: Normal rate and regular rhythm.   Pulmonary/Chest: Effort normal. No stridor. No respiratory distress.  Abdominal: She exhibits no distension.  Musculoskeletal: She exhibits edema and tenderness (approximately 3 cm diameter round, firm, tender area below distal wound, no erythema, warmth or induration).  Neurological: She is alert and oriented to person, place, and  time.  Skin: Skin is warm and dry. No rash noted. No erythema.  Incision on right outer thigh c/d/i  Nursing note and vitals reviewed.   ED Course  Procedures (including critical care time) Labs Review Labs Reviewed  CBC WITH DIFFERENTIAL/PLATELET - Abnormal; Notable for the following:    RBC 3.68 (*)    Hemoglobin 11.6 (*)    HCT 34.3 (*)    Platelets 454 (*)    All other components within normal limits  COMPREHENSIVE METABOLIC PANEL - Abnormal; Notable for the following:    Sodium 133 (*)    Potassium 3.3 (*)    CO2 21 (*)    Calcium 8.7 (*)    All other components within normal limits  C-REACTIVE PROTEIN    Imaging Review No results found. I have personally reviewed and evaluated these images and lab results as part of my medical decision-making.   EKG Interpretation None      MDM   Final diagnoses:  Pain of right lower extremity    39 yo F s/p recent right hip arthroplasty 2/2 AVN, increasing activeity recently and pain near wound starting yesterday with slight swelling. No erythema, warmth or fluctuance noted. Slightly more swollen in tender around distal wound site. No fever, systemic symptoms, leukocytosis to suggest infectious symptoms. D/W orthopedics on call for Dr. Vevelyn RoyalsBlackmans office, and she already has an appointment for the next morning for evaluation. He felt that if she didn't have any signs of systemic illness this far out from surgery it was more likely to be MSK strain v hematoma rather than h/w infction or abscess and she should be WBAT until tomorrow. Pain improved with muscle relaxers and pain meds in ED. Findings and plan d/w patient and family. Questions answered. Dc in stable condition.     Marily MemosJason Jeray Shugart, MD 10/23/15 [phone]

## 2015-10-19 NOTE — ED Notes (Signed)
Right hip surgery last Friday 10/09/15.  Notice Swelling last night to right hip.  Denies pain but c/o burning to right hip and swelling

## 2015-10-20 LAB — C-REACTIVE PROTEIN: CRP: <0.5 mg/dL (ref <1.0)

## 2016-01-21 ENCOUNTER — Encounter (HOSPITAL_COMMUNITY): Payer: Self-pay

## 2016-01-21 DIAGNOSIS — T887XXA Unspecified adverse effect of drug or medicament, initial encounter: Secondary | ICD-10-CM | POA: Diagnosis not present

## 2016-01-21 DIAGNOSIS — M1990 Unspecified osteoarthritis, unspecified site: Secondary | ICD-10-CM | POA: Insufficient documentation

## 2016-01-21 DIAGNOSIS — F17210 Nicotine dependence, cigarettes, uncomplicated: Secondary | ICD-10-CM | POA: Insufficient documentation

## 2016-01-21 DIAGNOSIS — G40909 Epilepsy, unspecified, not intractable, without status epilepticus: Secondary | ICD-10-CM | POA: Insufficient documentation

## 2016-01-21 DIAGNOSIS — R0602 Shortness of breath: Secondary | ICD-10-CM | POA: Diagnosis not present

## 2016-01-21 DIAGNOSIS — T7840XA Allergy, unspecified, initial encounter: Secondary | ICD-10-CM | POA: Diagnosis present

## 2016-01-21 DIAGNOSIS — Y658 Other specified misadventures during surgical and medical care: Secondary | ICD-10-CM | POA: Insufficient documentation

## 2016-01-21 DIAGNOSIS — J45909 Unspecified asthma, uncomplicated: Secondary | ICD-10-CM | POA: Insufficient documentation

## 2016-01-21 DIAGNOSIS — Z7982 Long term (current) use of aspirin: Secondary | ICD-10-CM | POA: Diagnosis not present

## 2016-01-21 DIAGNOSIS — T370X5A Adverse effect of sulfonamides, initial encounter: Secondary | ICD-10-CM | POA: Diagnosis not present

## 2016-01-21 DIAGNOSIS — I10 Essential (primary) hypertension: Secondary | ICD-10-CM | POA: Diagnosis not present

## 2016-01-21 NOTE — ED Notes (Signed)
My right lower leg is swelling, no know injury. Just had surgery on it back in November. Short of breath with walking.

## 2016-01-22 ENCOUNTER — Ambulatory Visit (HOSPITAL_COMMUNITY)
Admission: RE | Admit: 2016-01-22 | Discharge: 2016-01-22 | Disposition: A | Discharge status: Home / Self Care | Payer: Managed Care, Other (non HMO) | Source: Ambulatory Visit | Attending: Emergency Medicine | Admitting: Emergency Medicine

## 2016-01-22 ENCOUNTER — Emergency Department (HOSPITAL_COMMUNITY)
Admission: EM | Admit: 2016-01-22 | Discharge: 2016-01-22 | Disposition: A | Discharge status: Home / Self Care | Payer: Managed Care, Other (non HMO) | Attending: Emergency Medicine | Admitting: Emergency Medicine

## 2016-01-22 ENCOUNTER — Emergency Department (HOSPITAL_COMMUNITY): Payer: Managed Care, Other (non HMO)

## 2016-01-22 DIAGNOSIS — R079 Chest pain, unspecified: Secondary | ICD-10-CM

## 2016-01-22 DIAGNOSIS — M7989 Other specified soft tissue disorders: Secondary | ICD-10-CM | POA: Diagnosis not present

## 2016-01-22 LAB — I-STAT TROPONIN, ED: Troponin i, poc: 0.01 ng/mL (ref 0.00–0.08)

## 2016-01-22 LAB — BASIC METABOLIC PANEL
Anion gap: 6 (ref 5–15)
BUN: 15 mg/dL (ref 6–20)
CALCIUM: 8.6 mg/dL — AB (ref 8.9–10.3)
CO2: 24 mmol/L (ref 22–32)
CREATININE: 1.3 mg/dL — AB (ref 0.44–1.00)
Chloride: 106 mmol/L (ref 101–111)
GFR calc non Af Amer: 50 mL/min — ABNORMAL LOW (ref >60)
GFR, EST AFRICAN AMERICAN: 58 mL/min — AB (ref >60)
GLUCOSE: 118 mg/dL — AB (ref 65–99)
Potassium: 3.3 mmol/L — ABNORMAL LOW (ref 3.5–5.1)
Sodium: 136 mmol/L (ref 135–145)

## 2016-01-22 LAB — CBC WITH DIFFERENTIAL/PLATELET
BASOS PCT: 0 %
Basophils Absolute: 0 10*3/uL (ref 0.0–0.1)
EOS PCT: 2 %
Eosinophils Absolute: 0.2 10*3/uL (ref 0.0–0.7)
HEMATOCRIT: 35.5 % — AB (ref 36.0–46.0)
Hemoglobin: 12.1 g/dL (ref 12.0–15.0)
Lymphocytes Relative: 46 %
Lymphs Abs: 3.7 10*3/uL (ref 0.7–4.0)
MCH: 31.5 pg (ref 26.0–34.0)
MCHC: 34.1 g/dL (ref 30.0–36.0)
MCV: 92.4 fL (ref 78.0–100.0)
MONO ABS: 0.5 10*3/uL (ref 0.1–1.0)
MONOS PCT: 6 %
NEUTROS ABS: 3.6 10*3/uL (ref 1.7–7.7)
Neutrophils Relative %: 46 %
PLATELETS: 263 10*3/uL (ref 150–400)
RBC: 3.84 MIL/uL — ABNORMAL LOW (ref 3.87–5.11)
RDW: 13.2 % (ref 11.5–15.5)
WBC: 8 10*3/uL (ref 4.0–10.5)

## 2016-01-22 LAB — POC URINE PREG, ED: Preg Test, Ur: NEGATIVE

## 2016-01-22 LAB — TROPONIN I

## 2016-01-22 MED ORDER — ENOXAPARIN SODIUM 100 MG/ML ~~LOC~~ SOLN
1.0000 mg/kg | Freq: Once | SUBCUTANEOUS | Status: AC
  Administered 2016-01-22: 85 mg via SUBCUTANEOUS
  Filled 2016-01-22: qty 1

## 2016-01-22 MED ORDER — IOHEXOL 350 MG/ML SOLN
100.0000 mL | Freq: Once | INTRAVENOUS | Status: AC | PRN
  Administered 2016-01-22: 100 mL via INTRAVENOUS

## 2016-01-22 MED ORDER — POTASSIUM CHLORIDE CRYS ER 20 MEQ PO TBCR
40.0000 meq | EXTENDED_RELEASE_TABLET | Freq: Once | ORAL | Status: AC
  Administered 2016-01-22: 40 meq via ORAL
  Filled 2016-01-22: qty 2

## 2016-01-22 NOTE — Discharge Instructions (Signed)
Your EKG showed nonspecific changes but no sign of a heart attack. You have had 2 sets of negative cardiac labs. I recommend close follow-up with your PCP or a cardiologist for an outpatient stress test as you do have some risk factors for coronary artery disease. If you develop worsening of your chest pain or shortness of breath and it is constant, accompanied with sweating or feeling you may pass out, please return to the hospital.   Your CT of your chest showed no blood clot in your lungs, fluid on your lungs or pneumonia. Recommend she return to the emergency department later today for an ultrasound of your right leg to rule out a blood clot in your leg also known as a DVT. If this is negative I recommend close follow up with your primary care physician for further management for your leg pain. There is no sign of infection currently on exam and without history of injury it is unlikely that any bone is fractured or dislocated.     Nonspecific Chest Pain  Chest pain can be caused by many different conditions. There is always a chance that your pain could be related to something serious, such as a heart attack or a blood clot in your lungs. Chest pain can also be caused by conditions that are not life-threatening. If you have chest pain, it is very important to follow up with your health care provider. CAUSES  Chest pain can be caused by:  Heartburn.  Pneumonia or bronchitis.  Anxiety or stress.  Inflammation around your heart (pericarditis) or lung (pleuritis or pleurisy).  A blood clot in your lung.  A collapsed lung (pneumothorax). It can develop suddenly on its own (spontaneous pneumothorax) or from trauma to the chest.  Shingles infection (varicella-zoster virus).  Heart attack.  Damage to the bones, muscles, and cartilage that make up your chest wall. This can include:  Bruised bones due to injury.  Strained muscles or cartilage due to frequent or repeated coughing or  overwork.  Fracture to one or more ribs.  Sore cartilage due to inflammation (costochondritis). RISK FACTORS  Risk factors for chest pain may include:  Activities that increase your risk for trauma or injury to your chest.  Respiratory infections or conditions that cause frequent coughing.  Medical conditions or overeating that can cause heartburn.  Heart disease or family history of heart disease.  Conditions or health behaviors that increase your risk of developing a blood clot.  Having had chicken pox (varicella zoster). SIGNS AND SYMPTOMS Chest pain can feel like:  Burning or tingling on the surface of your chest or deep in your chest.  Crushing, pressure, aching, or squeezing pain.  Dull or sharp pain that is worse when you move, cough, or take a deep breath.  Pain that is also felt in your back, neck, shoulder, or arm, or pain that spreads to any of these areas. Your chest pain may come and go, or it may stay constant. DIAGNOSIS Lab tests or other studies may be needed to find the cause of your pain. Your health care provider may have you take a test called an ambulatory ECG (electrocardiogram). An ECG records your heartbeat patterns at the time the test is performed. You may also have other tests, such as:  Transthoracic echocardiogram (TTE). During echocardiography, sound waves are used to create a picture of all of the heart structures and to look at how blood flows through your heart.  Transesophageal echocardiogram (TEE).This is a more  advanced imaging test that obtains images from inside your body. It allows your health care provider to see your heart in finer detail.  Cardiac monitoring. This allows your health care provider to monitor your heart rate and rhythm in real time.  Holter monitor. This is a portable device that records your heartbeat and can help to diagnose abnormal heartbeats. It allows your health care provider to track your heart activity for  several days, if needed.  Stress tests. These can be done through exercise or by taking medicine that makes your heart beat more quickly.  Blood tests.  Imaging tests. TREATMENT  Your treatment depends on what is causing your chest pain. Treatment may include:  Medicines. These may include:  Acid blockers for heartburn.  Anti-inflammatory medicine.  Pain medicine for inflammatory conditions.  Antibiotic medicine, if an infection is present.  Medicines to dissolve blood clots.  Medicines to treat coronary artery disease.  Supportive care for conditions that do not require medicines. This may include:  Resting.  Applying heat or cold packs to injured areas.  Limiting activities until pain decreases. HOME CARE INSTRUCTIONS  If you were prescribed an antibiotic medicine, finish it all even if you start to feel better.  Avoid any activities that bring on chest pain.  Do not use any tobacco products, including cigarettes, chewing tobacco, or electronic cigarettes. If you need help quitting, ask your health care provider.  Do not drink alcohol.  Take medicines only as directed by your health care provider.  Keep all follow-up visits as directed by your health care provider. This is important. This includes any further testing if your chest pain does not go away.  If heartburn is the cause for your chest pain, you may be told to keep your head raised (elevated) while sleeping. This reduces the chance that acid will go from your stomach into your esophagus.  Make lifestyle changes as directed by your health care provider. These may include:  Getting regular exercise. Ask your health care provider to suggest some activities that are safe for you.  Eating a heart-healthy diet. A registered dietitian can help you to learn healthy eating options.  Maintaining a healthy weight.  Managing diabetes, if necessary.  Reducing stress. SEEK MEDICAL CARE IF:  Your chest pain  does not go away after treatment.  You have a rash with blisters on your chest.  You have a fever. SEEK IMMEDIATE MEDICAL CARE IF:   Your chest pain is worse.  You have an increasing cough, or you cough up blood.  You have severe abdominal pain.  You have severe weakness.  You faint.  You have chills.  You have sudden, unexplained chest discomfort.  You have sudden, unexplained discomfort in your arms, back, neck, or jaw.  You have shortness of breath at any time.  You suddenly start to sweat, or your skin gets clammy.  You feel nauseous or you vomit.  You suddenly feel light-headed or dizzy.  Your heart begins to beat quickly, or it feels like it is skipping beats. These symptoms may represent a serious problem that is an emergency. Do not wait to see if the symptoms will go away. Get medical help right away. Call your local emergency services (911 in the [place] Do not drive yourself to the hospital.   This information is not intended to replace advice given to you by your health care provider. Make sure you discuss any questions you have with your health care provider.  Document Released: 08/10/2005 Document Revised: 11/21/2014 Document Reviewed: 06/06/2014 Elsevier Interactive Patient Education [employer]

## 2016-01-22 NOTE — ED Provider Notes (Signed)
TIME SEEN: 12:40 AM  CHIEF COMPLAINT: RLE edema  HPI:  HPI Comments: Mariah Bell is a 40 y.o. female, with a h/o HTN, tobacco use who presents to the Emergency Department complaining of gradually worsening, constant right lower posterior leg swelling and pain X 4 days onset without injury or trauma. She associates SOB on exertion and intermittent centralized chest pain on exertion which she describes as 'my chest about to cave in'. She is a current daily smoker. The pt was admitted in November of 2016 to receive a right total hip arthroplasty performed by Dr. Rayburn MaBlackmon on 10/09/2015 for treatment of avascular necrosis of right hip bone. No h/o DM, HLD, or PE/DVT. No use of oral estrogen medications, recent long flights, recent surgery or recent hospitalization. Denies history of stress test or cardiac catheterization. Does not have a cardiologist. Has no chest pain or short of breath currently.  ROS: See HPI Constitutional: no fever  Eyes: no drainage  ENT: no runny nose   Cardiovascular:   chest pain  Resp:  SOB  GI: no vomiting GU: no dysuria Integumentary: no rash  Allergy: no hives  Musculoskeletal: Right leg swelling  Neurological: no slurred speech ROS otherwise negative  PAST MEDICAL HISTORY/PAST SURGICAL HISTORY:  Past Medical History  Diagnosis Date  . Hypertension   . Asthma   . Arthritis   . Chronic hip pain   . Chronic back pain     MEDICATIONS:  Prior to Admission medications   Medication Sig Start Date End Date Taking? Authorizing Provider  albuterol (PROVENTIL HFA;VENTOLIN HFA) 108 (90 BASE) MCG/ACT inhaler Inhale 2 puffs into the lungs every 6 (six) hours as needed for wheezing or shortness of breath.    Historical Provider, MD  aspirin EC 325 MG EC tablet Take 1 tablet (325 mg total) by mouth 2 (two) times daily after a meal. 10/11/15   Kirtland BouchardGilbert W Clark, PA-C  diazepam (VALIUM) 5 MG tablet Take 1 tablet (5 mg total) by mouth every 6 (six) hours as needed for  muscle spasms (spasms). 10/19/15   Marily MemosJason Mesner, MD  lisinopril-hydrochlorothiazide (PRINZIDE,ZESTORETIC) 20-12.5 MG per tablet Take 1 tablet by mouth daily.  01/28/14   Historical Provider, MD  methocarbamol (ROBAXIN) 500 MG tablet Take 1 tablet (500 mg total) by mouth every 6 (six) hours as needed for muscle spasms. 10/11/15   Kirtland BouchardGilbert W Clark, PA-C  oxyCODONE-acetaminophen (ROXICET) 5-325 MG tablet Take 1-2 tablets by mouth every 4 (four) hours as needed for severe pain. Patient taking differently: Take 1-2 tablets by mouth every 4 (four) hours as needed for moderate pain or severe pain.  10/11/15   Kirtland BouchardGilbert W Clark, PA-C  zolpidem (AMBIEN) 10 MG tablet Take 10 mg by mouth at bedtime.  06/09/14   Historical Provider, MD    ALLERGIES:  No Known Allergies  SOCIAL HISTORY:  Social History  Substance Use Topics  . Smoking status: Current Every Day Smoker -- 0.50 packs/day    Types: Cigarettes  . Smokeless tobacco: Never Used  . Alcohol Use: No    FAMILY HISTORY: Family History  Problem Relation Age of Onset  . Diabetes Mother     EXAM: BP 179/111 mmHg  Pulse 73  Temp(Src) 97.1 F (36.2 C) (Oral)  Resp 22  Ht 5\' 9"  (1.753 m)  Wt 190 lb (86.183 kg)  BMI 28.05 kg/m2  SpO2 99%  LMP 12/31/2015 (Approximate) CONSTITUTIONAL: Alert and oriented and responds appropriately to questions. Well-appearing; well-nourished HEAD: Normocephalic EYES: Conjunctivae clear, PERRL  ENT: normal nose; no rhinorrhea; moist mucous membranes NECK: Supple, no meningismus, no LAD  CARD: RRR; S1 and S2 appreciated; no murmurs, no clicks, no rubs, no gallops RESP: Normal chest excursion without splinting or tachypnea; breath sounds clear and equal bilaterally; no wheezes, no rhonchi, no rales, no hypoxia or respiratory distress, speaking full sentences ABD/GI: Normal bowel sounds; non-distended; soft, non-tender, no rebound, no guarding, no peritoneal signs BACK:  The back appears normal and is non-tender to  palpation, there is no CVA tenderness EXT: Normal ROM in all joints; non-tender to palpation; no edema; normal capillary refill; no cyanosis, right calf tenderness and swelling with no erythema or warmth, compartments are soft, 2+ DP pulses bilaterally, surgical incision over right lateral, anterior hip that is well healed without erythema, warmth, tenderness or swelling of right hip, no joint effusions  SKIN: Normal color for age and race; warm; no rash NEURO: Moves all extremities equally, sensation to light touch intact diffusely, cranial nerves II through XII intact PSYCH: The patient's mood and manner are appropriate. Grooming and personal hygiene are appropriate.  MEDICAL DECISION MAKING: Patient here with complaints of right leg swelling and chest pain and shortness of breath with exertion that started 4 days ago. No chest pain or shortness of breath currently. Hemodynamically stable. EKG shows nonspecific ST flattening. No other ischemic abnormality. Concern for possible DVT and pulmonary embolus. She is a smoker and recently had surgery and has been not as mobile as normal because of her total right hip replacement. There is no sign of infection on exam in her leg and her palms are soft. She is neurovascularly intact distally. We'll obtain labs including troponin she does have some risk factors for ACS as well as a CT of her chest. Because of her risk factors are feel she is too high risk for a d-dimer. She declines pain medication at this time.  ED PROGRESS: Labs unremarkable except mildly low potassium at 3.3 which we will replace and Myl[place] with a creatinine of 1.3. Troponin is negative. Pregnancy test negative. CT scan shows no pulmonary was, infiltrate, edema or pneumothorax. Plan is to repeat second troponin at 4:30 AM, 3 hours after the first set. This is negative I feel she can be discharged home.  Her HEART score is 2-3 (based on points for history, nonspecific EKG changes, risk factors).   Her risk of a major cardiac event in the next 6 weeks is less than 2%. I have recommended close outpatient follow-up for a stress test and she agrees.    Her second troponin is negative. She still chest pain-free. She will come back later today for an ultrasound of her leg. Discussed with patient if her ultrasound is negative I recommend she follow-up with her PCP for further management for her leg pain. Again there is no sign of infection and no history of injury. Her compartments are soft and she is neurovascularly intact distally. Have recommended close outpatient follow-up with her PCP or cardiologist for a stress test and she agrees with this plan. Discussed at length stricture return precautions. She verbalized understanding and is comfortable with this plan.    EKG Interpretation  Date/Time:  Friday January 22 2016 01:00:34 EST Ventricular Rate:  64 PR Interval:  147 QRS Duration: 82 QT Interval:  421 QTC Calculation: 434 R Axis:   50 Text Interpretation:  Sinus rhythm Probable left atrial enlargement Nonspecific T abnrm, anterolateral leads that are new compared to prior EKG Confirmed by Kimm Sider,  DO, Nikyah Lackman ([id number]) on 01/22/2016 2:38:15 AM          I personally performed the services described in this documentation, which was scribed in my presence. The recorded information has been reviewed and is accurate.   Layla Maw Sedra Morfin, DO 01/22/16 [phone]

## 2016-01-22 NOTE — ED Provider Notes (Signed)
Patient informed of negative doppler.  Benjiman CoreNathan Ashritha Desrosiers, MD 01/22/16 [phone]

## 2016-01-24 ENCOUNTER — Emergency Department (HOSPITAL_COMMUNITY)
Admission: EM | Admit: 2016-01-24 | Discharge: 2016-01-24 | Disposition: A | Discharge status: Home / Self Care | Payer: Managed Care, Other (non HMO) | Attending: Emergency Medicine | Admitting: Emergency Medicine

## 2016-01-24 ENCOUNTER — Encounter (HOSPITAL_COMMUNITY): Payer: Self-pay | Admitting: Family Medicine

## 2016-01-24 DIAGNOSIS — J45909 Unspecified asthma, uncomplicated: Secondary | ICD-10-CM | POA: Insufficient documentation

## 2016-01-24 DIAGNOSIS — G8929 Other chronic pain: Secondary | ICD-10-CM | POA: Insufficient documentation

## 2016-01-24 DIAGNOSIS — Y9289 Other specified places as the place of occurrence of the external cause: Secondary | ICD-10-CM | POA: Diagnosis not present

## 2016-01-24 DIAGNOSIS — Y999 Unspecified external cause status: Secondary | ICD-10-CM | POA: Diagnosis not present

## 2016-01-24 DIAGNOSIS — R21 Rash and other nonspecific skin eruption: Secondary | ICD-10-CM

## 2016-01-24 DIAGNOSIS — X58XXXA Exposure to other specified factors, initial encounter: Secondary | ICD-10-CM | POA: Diagnosis not present

## 2016-01-24 DIAGNOSIS — Z79899 Other long term (current) drug therapy: Secondary | ICD-10-CM | POA: Diagnosis not present

## 2016-01-24 DIAGNOSIS — M1990 Unspecified osteoarthritis, unspecified site: Secondary | ICD-10-CM | POA: Diagnosis not present

## 2016-01-24 DIAGNOSIS — Y9389 Activity, other specified: Secondary | ICD-10-CM | POA: Insufficient documentation

## 2016-01-24 DIAGNOSIS — Z7982 Long term (current) use of aspirin: Secondary | ICD-10-CM | POA: Insufficient documentation

## 2016-01-24 DIAGNOSIS — I10 Essential (primary) hypertension: Secondary | ICD-10-CM | POA: Diagnosis not present

## 2016-01-24 DIAGNOSIS — F17210 Nicotine dependence, cigarettes, uncomplicated: Secondary | ICD-10-CM | POA: Insufficient documentation

## 2016-01-24 DIAGNOSIS — T7840XA Allergy, unspecified, initial encounter: Secondary | ICD-10-CM

## 2016-01-24 MED ORDER — FAMOTIDINE 20 MG PO TABS: 20.0000 mg | ORAL_TABLET | Freq: Two times a day (BID) | ORAL | Status: DC

## 2016-01-24 MED ORDER — PREDNISONE 10 MG PO TABS: ORAL_TABLET | ORAL | Status: DC

## 2016-01-24 MED ORDER — DIPHENHYDRAMINE HCL 25 MG PO TABS: 25.0000 mg | ORAL_TABLET | Freq: Four times a day (QID) | ORAL | Status: DC

## 2016-01-24 MED ORDER — FAMOTIDINE 20 MG PO TABS
20.0000 mg | ORAL_TABLET | Freq: Once | ORAL | Status: AC
  Administered 2016-01-24: 20 mg via ORAL
  Filled 2016-01-24: qty 1

## 2016-01-24 MED ORDER — PREDNISONE 20 MG PO TABS
60.0000 mg | ORAL_TABLET | Freq: Once | ORAL | Status: AC
  Administered 2016-01-24: 60 mg via ORAL
  Filled 2016-01-24: qty 3

## 2016-01-24 MED ORDER — PREDNISONE 10 MG PO TABS: 20.0000 mg | ORAL_TABLET | Freq: Every day | ORAL | Status: DC

## 2016-01-24 NOTE — ED Provider Notes (Signed)
CSN: [id number]     Arrival date & time 01/24/16  1156 History  By signing my name below, I, Mariah Bell, attest that this documentation has been prepared under the direction and in the presence of Mariah Bell, [employer] Electronically Signed: Soijett Bell, ED Scribe. 01/24/2016. 1:07 PM.   Chief Complaint  Patient presents with  . Rash      The history is provided by the patient. No language interpreter was used.    Mariah Bell is a 40 y.o. female with a medical hx of HTN who presents to the Emergency Department complaining of burning rash to her arms, face, and injection site x 3 days. Pt notes that she was in the ED 3 days ago to rule out a blood clot in her leg. Pt had an [place] and CT completed while in the ED and she is unsure if she is allergic to the contrast dye or a Lovenox shot she received.. Pt denies new soaps/medications/facial wash/lotion/detergent. Pt is having associated symptoms of color change. She notes that she has not tried any medications for the relief of her symptoms. She denies wound, mouth lesions, and any other symptoms. Denies PMHx of DM at this time.  Per pt chart review: Pt was seen in the ED on 01/22/16 for RLE edema. Pt had labs, [place], and EKG completed at the time. Pt was treated with levonox for her symptoms.   Past Medical History  Diagnosis Date  . Hypertension   . Asthma   . Arthritis   . Chronic hip pain   . Chronic back pain    Past Surgical History  Procedure Laterality Date  . Tonsillectomy    . Tubal ligation    . 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 Diabetes Mother    Social History  Substance Use Topics  . Smoking status: Current Every Day Smoker -- 0.50 packs/day    Types: Cigarettes  . Smokeless tobacco: Never Used   . Alcohol Use: No   OB History    Gravida Para Term Preterm AB TAB SAB Ectopic Multiple Living   Review of Systems  Constitutional: Negative for fever and chills.  HENT: Negative for mouth sores.   Skin: Positive for color change and rash. Negative for wound.      Allergies  Review of patient's allergies indicates no known allergies.  Home Medications   Prior to Admission medications   Medication Sig Start Date End Date Taking? Authorizing Provider  albuterol (PROVENTIL HFA;VENTOLIN HFA) 108 (90 BASE) MCG/ACT inhaler Inhale 2 puffs into the lungs every 6 (six) hours as needed for wheezing or shortness of breath.    Historical Provider, MD  aspirin EC 325 MG EC tablet Take 1 tablet (325 mg total) by mouth 2 (two) times daily after a meal. 10/11/15   Kirtland Bouchard, PA-C  diazepam (VALIUM) 5 MG tablet Take 1 tablet (5 mg total) by mouth every 6 (six) hours as needed for muscle spasms (spasms). 10/19/15   Marily Memos, MD  lisinopril-hydrochlorothiazide (PRINZIDE,ZESTORETIC) 20-12.5 MG per tablet Take 1 tablet by mouth daily.  01/28/14   Historical Provider, MD  methocarbamol (ROBAXIN) 500 MG tablet Take 1 tablet (500 mg total) by  mouth every 6 (six) hours as needed for muscle spasms. 10/11/15   Kirtland BouchardGilbert W Clark, PA-C  oxyCODONE-acetaminophen (ROXICET) 5-325 MG tablet Take 1-2 tablets by mouth every 4 (four) hours as needed for severe pain. Patient taking differently: Take 1-2 tablets by mouth every 4 (four) hours as needed for moderate pain or severe pain.  10/11/15   Kirtland BouchardGilbert W Clark, PA-C  zolpidem (AMBIEN) 10 MG tablet Take 10 mg by mouth at bedtime.  06/09/14   Historical Provider, MD   BP 172/108 mmHg  Pulse 65  Temp(Src) 98.4 F (36.9 C) (Oral)  Resp 18  SpO2 99%  LMP 12/31/2015 (Approximate) Physical Exam  Constitutional: She is oriented to person, place, and time. She appears well-developed and well-nourished. No distress.  HENT:  Head: Normocephalic.   No rash over her lips or oral mucosa. No swelling to the lips, tongue, oropharynx  Eyes: Conjunctivae are normal.  Neck: Neck supple.  Cardiovascular: Normal rate, regular rhythm and normal heart sounds.   Pulmonary/Chest: Effort normal and breath sounds normal. No respiratory distress. She has no wheezes. She has no rales.  Neurological: She is alert and oriented to person, place, and time.  Skin: Skin is warm and dry.  Diffuse papular rash over bilateral arms, face, back, chest, abdomen. Rash is itchy and burning.  Nursing note and vitals reviewed.   ED Course  Procedures (including critical care time) DIAGNOSTIC STUDIES: Oxygen Saturation is 99% on RA, nl by my interpretation.    COORDINATION OF CARE: 1:07 PM Discussed treatment plan with pt at bedside and pt agreed to plan.    Labs Review Labs Reviewed - No data to display  Imaging Review [place] Venous Img Lower Unilateral Right  01/22/2016  CLINICAL DATA:  Swelling and pain of posterior right leg for 4 days EXAM: RIGHT LOWER EXTREMITY VENOUS DOPPLER ULTRASOUND TECHNIQUE: Gray-scale sonography with graded compression, as well as color Doppler and duplex ultrasound were performed to evaluate the lower extremity deep venous systems from the level of the common femoral vein and including the common femoral, femoral, profunda femoral, popliteal and calf veins including the posterior tibial, peroneal and gastrocnemius veins when visible. The superficial great saphenous vein was also interrogated. Spectral Doppler was utilized to evaluate flow at rest and with distal augmentation maneuvers in the common femoral, femoral and popliteal veins. COMPARISON:  None. FINDINGS: Contralateral Common Femoral Vein: Respiratory phasicity is normal and symmetric with the symptomatic side. No evidence of thrombus. Normal compressibility. Common Femoral Vein: No evidence of thrombus. Normal compressibility, respiratory phasicity and response to augmentation.  Saphenofemoral Junction: No evidence of thrombus. Normal compressibility and flow on color Doppler imaging. Profunda Femoral Vein: No evidence of thrombus. Normal compressibility and flow on color Doppler imaging. Femoral Vein: No evidence of thrombus. Normal compressibility, respiratory phasicity and response to augmentation. Popliteal Vein: No evidence of thrombus. Normal compressibility, respiratory phasicity and response to augmentation. Calf Veins: No evidence of thrombus. Normal compressibility and flow on color Doppler imaging. Superficial Great Saphenous Vein: No evidence of thrombus. Normal compressibility and flow on color Doppler imaging. Other Findings: There are varicosities in the right calf. No evidence of mass in the area of pain. IMPRESSION: No evidence of deep venous thrombosis. Electronically Signed   By: Esperanza Heiraymond  Rubner M.D.   On: 01/22/2016 15:48     EKG Interpretation None      MDM   Final diagnoses:  Rash  Allergic reaction, initial encounter    Patient with diffuse rash over the  body, onset after being in emergency department. Most likely allergic reaction, question whether it's due to Lovenox or from CT scan IV dye. Patient denies any other new medications, products, detergents, foods. No evidence of angioedema or Viviann Spare Johnson's exam .Patient snontoxic appearing. She is hypertensive, she'll need to follow-up with her doctor for recheck of her blood pressure, and to make sure that she takes all of her medications. Will start on prednisone, Benadryl, Pepcid. Follow up with PCP. Return precuations discussed.   Filed Vitals:   01/24/16 1205  BP: 172/108  Pulse: 65  Temp: 98.4 F (36.9 C)  TempSrc: Oral  Resp: 18  SpO2: 99%     Mariah Crumble, PA-C 01/24/16 1328  Lorre Nick, MD 01/24/16 [phone]

## 2016-01-24 NOTE — Discharge Instructions (Signed)
Take prednisone as prescribed until all gone. Take benadryl as prescribed for itching. Take pepcid as prescribed daily. Follow up with your doctor for recheck. Return if worsening.   Rash A rash is a change in the color or texture of the skin. There are many different types of rashes. You may have other problems that accompany your rash. CAUSES   Infections.  Allergic reactions. This can include allergies to pets or foods.  Certain medicines.  Exposure to certain chemicals, soaps, or cosmetics.  Heat.  Exposure to poisonous plants.  Tumors, both cancerous and noncancerous. SYMPTOMS   Redness.  Scaly skin.  Itchy skin.  Dry or cracked skin.  Bumps.  Blisters.  Pain. DIAGNOSIS  Your caregiver may do a physical exam to determine what type of rash you have. A skin sample (biopsy) may be taken and examined under a microscope. TREATMENT  Treatment depends on the type of rash you have. Your caregiver may prescribe certain medicines. For serious conditions, you may need to see a skin doctor (dermatologist). HOME CARE INSTRUCTIONS   Avoid the substance that caused your rash.  Do not scratch your rash. This can cause infection.  You may take cool baths to help stop itching.  Only take over-the-counter or prescription medicines as directed by your caregiver.  Keep all follow-up appointments as directed by your caregiver. SEEK IMMEDIATE MEDICAL CARE IF:  You have increasing pain, swelling, or redness.  You have a fever.  You have new or severe symptoms.  You have body aches, diarrhea, or vomiting.  Your rash is not better after 3 days. MAKE SURE YOU:  Understand these instructions.  Will watch your condition.  Will get help right away if you are not doing well or get worse.   This information is not intended to replace advice given to you by your health care provider. Make sure you discuss any questions you have with your health care provider.   Document  Released: 10/21/2002 Document Revised: 11/21/2014 Document Reviewed: 03/18/2015 Elsevier Interactive Patient Education [employer]2016 Elsevier Inc.

## 2016-01-24 NOTE — ED Notes (Signed)
Declined W/C at D/C and was escorted to lobby by RN. 

## 2016-01-24 NOTE — ED Notes (Signed)
Pt here for itching and burning all over since this am. sts she was given some [facility] ago [facility]

## 2016-03-21 ENCOUNTER — Encounter (HOSPITAL_COMMUNITY): Payer: Self-pay | Admitting: *Deleted

## 2016-03-21 ENCOUNTER — Emergency Department (HOSPITAL_COMMUNITY): Payer: Managed Care, Other (non HMO)

## 2016-03-21 ENCOUNTER — Emergency Department (HOSPITAL_COMMUNITY)
Admission: EM | Admit: 2016-03-21 | Discharge: 2016-03-21 | Disposition: A | Discharge status: Home / Self Care | Payer: Managed Care, Other (non HMO) | Attending: Emergency Medicine | Admitting: Emergency Medicine

## 2016-03-21 DIAGNOSIS — M25551 Pain in right hip: Secondary | ICD-10-CM | POA: Insufficient documentation

## 2016-03-21 DIAGNOSIS — M79651 Pain in right thigh: Secondary | ICD-10-CM | POA: Diagnosis present

## 2016-03-21 DIAGNOSIS — I10 Essential (primary) hypertension: Secondary | ICD-10-CM | POA: Diagnosis not present

## 2016-03-21 DIAGNOSIS — M7989 Other specified soft tissue disorders: Secondary | ICD-10-CM | POA: Insufficient documentation

## 2016-03-21 DIAGNOSIS — F17210 Nicotine dependence, cigarettes, uncomplicated: Secondary | ICD-10-CM | POA: Diagnosis not present

## 2016-03-21 DIAGNOSIS — J45909 Unspecified asthma, uncomplicated: Secondary | ICD-10-CM | POA: Insufficient documentation

## 2016-03-21 DIAGNOSIS — R609 Edema, unspecified: Secondary | ICD-10-CM

## 2016-03-21 LAB — CBC WITH DIFFERENTIAL/PLATELET
Basophils Absolute: 0 10*3/uL (ref 0.0–0.1)
Basophils Relative: 1 %
Eosinophils Absolute: 0.2 10*3/uL (ref 0.0–0.7)
Eosinophils Relative: 3 %
HEMATOCRIT: 35.5 % — AB (ref 36.0–46.0)
Hemoglobin: 12 g/dL (ref 12.0–15.0)
LYMPHS ABS: 3 10*3/uL (ref 0.7–4.0)
LYMPHS PCT: 48 %
MCH: 31 pg (ref 26.0–34.0)
MCHC: 33.8 g/dL (ref 30.0–36.0)
MCV: 91.7 fL (ref 78.0–100.0)
MONO ABS: 0.4 10*3/uL (ref 0.1–1.0)
MONOS PCT: 6 %
NEUTROS ABS: 2.6 10*3/uL (ref 1.7–7.7)
Neutrophils Relative %: 42 %
Platelets: 305 10*3/uL (ref 150–400)
RBC: 3.87 MIL/uL (ref 3.87–5.11)
RDW: 12.9 % (ref 11.5–15.5)
WBC: 6.2 10*3/uL (ref 4.0–10.5)

## 2016-03-21 LAB — COMPREHENSIVE METABOLIC PANEL
ALBUMIN: 3.9 g/dL (ref 3.5–5.0)
ALK PHOS: 47 U/L (ref 38–126)
ALT: 15 U/L (ref 14–54)
ANION GAP: 8 (ref 5–15)
AST: 13 U/L — ABNORMAL LOW (ref 15–41)
BUN: 11 mg/dL (ref 6–20)
CO2: 27 mmol/L (ref 22–32)
Calcium: 8.8 mg/dL — ABNORMAL LOW (ref 8.9–10.3)
Chloride: 105 mmol/L (ref 101–111)
Creatinine, Ser: 0.89 mg/dL (ref 0.44–1.00)
GFR calc Af Amer: >60 mL/min (ref >60)
GFR calc non Af Amer: >60 mL/min (ref >60)
GLUCOSE: 98 mg/dL (ref 65–99)
POTASSIUM: 3.5 mmol/L (ref 3.5–5.1)
SODIUM: 140 mmol/L (ref 135–145)
Total Bilirubin: 0.3 mg/dL (ref 0.3–1.2)
Total Protein: 7.6 g/dL (ref 6.5–8.1)

## 2016-03-21 LAB — C-REACTIVE PROTEIN: CRP: <0.5 mg/dL (ref <1.0)

## 2016-03-21 LAB — SEDIMENTATION RATE: Sed Rate: 30 mm/hr — ABNORMAL HIGH (ref 0–22)

## 2016-03-21 MED ORDER — ONDANSETRON HCL 4 MG/2ML IJ SOLN
4.0000 mg | Freq: Once | INTRAMUSCULAR | Status: AC
  Administered 2016-03-21: 4 mg via INTRAVENOUS
  Filled 2016-03-21: qty 2

## 2016-03-21 MED ORDER — NAPROXEN 500 MG PO TABS: ORAL_TABLET | ORAL | Status: DC

## 2016-03-21 MED ORDER — IOPAMIDOL (ISOVUE-300) INJECTION 61%
100.0000 mL | Freq: Once | INTRAVENOUS | Status: AC | PRN
Start: 2016-03-21 — End: 2016-03-21
  Administered 2016-03-21: 100 mL via INTRAVENOUS

## 2016-03-21 MED ORDER — SODIUM CHLORIDE 0.9 % IV SOLN
INTRAVENOUS | Status: DC
  Administered 2016-03-21: 02:00:00 via INTRAVENOUS

## 2016-03-21 MED ORDER — FENTANYL CITRATE (PF) 100 MCG/2ML IJ SOLN
50.0000 ug | Freq: Once | INTRAMUSCULAR | Status: AC
  Administered 2016-03-21: 50 ug via INTRAVENOUS
  Filled 2016-03-21: qty 2

## 2016-03-21 MED ORDER — KETOROLAC TROMETHAMINE 30 MG/ML IJ SOLN
30.0000 mg | Freq: Once | INTRAMUSCULAR | Status: AC
  Administered 2016-03-21: 30 mg via INTRAVENOUS
  Filled 2016-03-21: qty 1

## 2016-03-21 MED ORDER — CYCLOBENZAPRINE HCL 10 MG PO TABS: 10.0000 mg | ORAL_TABLET | Freq: Three times a day (TID) | ORAL | Status: DC | PRN

## 2016-03-21 NOTE — ED Provider Notes (Signed)
CSN: [id number]     Arrival date & time 03/21/16  0040 History   First MD Initiated Contact with Patient 03/21/16 0155    Chief Complaint  Patient presents with  . Leg Pain     (Consider location/radiation/quality/duration/timing/severity/associated sxs/prior Treatment) HPI patient reports she had a right hip replacement done in November. She states she has been having pain in her right hip area for the past month. She has been seen by her primary care doctor at [facility] and by Dr. Eliberto IvoryBlackman's PA about a week ago. She states they did x-rays and blood work but she's not heard the test results. She denies fever, nausea, or vomiting. She denies any known injury. She states she had done her physical therapy the week after her surgery. She is not currently doing any physical therapy. She states she has had to go back to using a cane because of the pain. She reports she had several tests to look for DVT prior to her surgery that were negative.  [facility] Magnus Ivan[facility]man  Past Medical History  Diagnosis Date  . Hypertension   . Asthma   . Arthritis   . Chronic hip pain   . Chronic back pain    Past Surgical History  Procedure Laterality Date  . Tonsillectomy    . Tubal ligation    . 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 Diabetes Mother    Social History  Substance Use Topics  . Smoking status: Current Every Day Smoker -- 0.50 packs/day    Types: Cigarettes  . Smokeless tobacco: Never Used  . Alcohol Use: No   Employed Uses a cane  OB History    Gravida Para Term Preterm AB TAB SAB Ectopic Multiple Living   3 2 1 1 1 1          Review of Systems  All other systems reviewed and are negative.     Allergies  Review of patient's allergies  indicates no known allergies.  Home Medications   Prior to Admission medications   Medication Sig Start Date End Date Taking? Authorizing Provider  albuterol (PROVENTIL HFA;VENTOLIN HFA) 108 (90 BASE) MCG/ACT inhaler Inhale 2 puffs into the lungs every 6 (six) hours as needed for wheezing or shortness of breath.    Historical Provider, MD  aspirin EC 325 MG EC tablet Take 1 tablet (325 mg total) by mouth 2 (two) times daily after a meal. 10/11/15   Kirtland BouchardGilbert W Clark, PA-C  cyclobenzaprine (FLEXERIL) 10 MG tablet Take 1 tablet (10 mg total) by mouth 3 (three) times daily as needed (muscle soreness). 03/21/16   Devoria AlbeIva Kailyn Vanderslice, MD  diazepam (VALIUM) 5 MG tablet Take 1 tablet (5 mg total) by mouth every 6 (six) hours as needed for muscle spasms (spasms). 10/19/15   Marily MemosJason Mesner, MD  diphenhydrAMINE (BENADRYL) 25 MG tablet Take 1 tablet (25 mg total) by mouth every 6 (six) hours. 01/24/16   Tatyana Kirichenko, PA-C  famotidine (PEPCID) 20 MG tablet Take 1 tablet (20 mg total) by mouth 2 (two) times daily. 01/24/16   Tatyana Kirichenko, PA-C  lisinopril-hydrochlorothiazide (PRINZIDE,ZESTORETIC) 20-12.5 MG per tablet Take 1 tablet by mouth daily.  01/28/14   Historical Provider, MD  methocarbamol (ROBAXIN) 500 MG tablet Take 1 tablet (500 mg total)  by mouth every 6 (six) hours as needed for muscle spasms. 10/11/15   Kirtland Bouchard, PA-C  naproxen (NAPROSYN) 500 MG tablet Take 1 po BID with food prn pain 03/21/16   Devoria Albe, MD  oxyCODONE-acetaminophen (ROXICET) 5-325 MG tablet Take 1-2 tablets by mouth every 4 (four) hours as needed for severe pain. Patient taking differently: Take 1-2 tablets by mouth every 4 (four) hours as needed for moderate pain or severe pain.  10/11/15   Kirtland Bouchard, PA-C  predniSONE (DELTASONE) 10 MG tablet Take 2 tablets (20 mg total) by mouth daily. 01/24/16   Tatyana Kirichenko, PA-C  predniSONE (DELTASONE) 10 MG tablet Take 5 tab day 1, take 4 tab day 2, take 3 tab day 3, take 2 tab day  4, and take 1 tab day 5 01/24/16   Tatyana Kirichenko, PA-C  zolpidem (AMBIEN) 10 MG tablet Take 10 mg by mouth at bedtime.  06/09/14   Historical Provider, MD   BP 146/99 mmHg  Pulse 79  Temp(Src) 97.6 F (36.4 C) (Oral)  Resp 20  Ht  (1.753 m)  Wt 209 lb (94.802 kg)  BMI 30.85 kg/m2  SpO2 99%  LMP 03/20/2016  Vital signs normal   Physical Exam  Constitutional: She is oriented to person, place, and time. She appears well-developed and well-nourished.  Non-toxic appearance. She does not appear ill. No distress.  HENT:  Head: Normocephalic and atraumatic.  Right Ear: External ear normal.  Left Ear: External ear normal.  Nose: Nose normal. No mucosal edema or rhinorrhea.  Mouth/Throat: Mucous membranes are normal. No dental abscesses or uvula swelling.  Eyes: Conjunctivae and EOM are normal.  Neck: Normal range of motion and full passive range of motion without pain.  Pulmonary/Chest: Effort normal. No respiratory distress. She has no rhonchi. She exhibits no crepitus.  Abdominal: Normal appearance.  Musculoskeletal: Normal range of motion. She exhibits no edema or tenderness.       Legs: Moves all extremities well. Patient has a well-healed surgical scar consistent with her right hip replacement. Her right thigh does appear to be larger than her left however there was no redness, induration, or subcutaneous changes that I can feel by palpation. The skin is not red or inflamed. Area of pain and swelling noted  Neurological: She is alert and oriented to person, place, and time. She has normal strength. No cranial nerve deficit.  Skin: Skin is warm, dry and intact. No rash noted. No erythema. No pallor.  Psychiatric: She has a normal mood and affect. Her speech is normal and behavior is normal. Her mood appears not anxious.  Nursing note and vitals reviewed.   ED Course  Procedures (including critical care time)  Medications  0.9 %  sodium chloride infusion ( Intravenous New  Bag/Given 03/21/16 0218)  ketorolac (TORADOL) 30 MG/ML injection 30 mg (not administered)  fentaNYL (SUBLIMAZE) injection 50 mcg (50 mcg Intravenous Given 03/21/16 0219)  ondansetron (ZOFRAN) injection 4 mg (4 mg Intravenous Given 03/21/16 0219)  iopamidol (ISOVUE-300) 61 % injection 100 mL (100 mLs Intravenous Contrast Given 03/21/16 0311)    Patient was given IV pain medication. CT scan was ordered to evaluate her persistent pain since her hip surgery. MRI is not available here at night or on the weekends. The CT will hopefully show if she has an abscess or any abnormal fluid collection such as a seroma.  At time of discharge patient was given her test results. She is to follow-up with her  orthopedist to see what else he needs to do to evaluate her chronic pain.  Review of the [place] shows patient was getting oxycodone written from [facility]. She received #90 oxycodone 5/325 on November 28, #60 on December 6, #90 on January 4, and #60 on February 3.   Labs Review Results for orders placed or performed during the hospital encounter of 03/21/16  Comprehensive metabolic panel  Result Value Ref Range   Sodium 140 135 - 145 mmol/L   Potassium 3.5 3.5 - 5.1 mmol/L   Chloride 105 101 - 111 mmol/L   CO2 27 22 - 32 mmol/L   Glucose, Bld 98 65 - 99 mg/dL   BUN 11 6 - 20 mg/dL   Creatinine, Ser 1.61 0.44 - 1.00 mg/dL   Calcium 8.8 (L) 8.9 - 10.3 mg/dL   Total Protein 7.6 6.5 - 8.1 g/dL   Albumin 3.9 3.5 - 5.0 g/dL   AST 13 (L) 15 - 41 U/L   ALT 15 14 - 54 U/L   Alkaline Phosphatase 47 38 - 126 U/L   Total Bilirubin 0.3 0.3 - 1.2 mg/dL   GFR calc non Af Amer >60 >60 mL/min   GFR calc Af Amer >60 >60 mL/min   Anion gap 8 5 - 15  CBC with Differential  Result Value Ref Range   WBC 6.2 4.0 - 10.5 K/uL   RBC 3.87 3.87 - 5.11 MIL/uL   Hemoglobin 12.0 12.0 - 15.0 g/dL   HCT 09.6 (L) 04.5 - 40.9 %   MCV 91.7 78.0 - 100.0 fL   MCH 31.0 26.0 - 34.0 pg   MCHC 33.8 30.0 -  36.0 g/dL   RDW 81.1 91.4 - 78.2 %   Platelets 305 150 - 400 K/uL   Neutrophils Relative % 42 %   Neutro Abs 2.6 1.7 - 7.7 K/uL   Lymphocytes Relative 48 %   Lymphs Abs 3.0 0.7 - 4.0 K/uL   Monocytes Relative 6 %   Monocytes Absolute 0.4 0.1 - 1.0 K/uL   Eosinophils Relative 3 %   Eosinophils Absolute 0.2 0.0 - 0.7 K/uL   Basophils Relative 1 %   Basophils Absolute 0.0 0.0 - 0.1 K/uL  Sedimentation rate  Result Value Ref Range   Sed Rate 30 (H) 0 - 22 mm/hr   Laboratory interpretation all normal except Mildly elevated sedimentation rate     Imaging Review Ct Hip Right W Contrast  03/21/2016  CLINICAL DATA:  Pain and swelling to the right lower extremity since a total hip arthroplasty on 10/09/2015 EXAM: CT OF THE RIGHT HIP WITH CONTRAST TECHNIQUE: Multidetector CT imaging was performed following the standard protocol during bolus administration of intravenous contrast. CONTRAST:  ISOVUE-300 IOPAMIDOL (ISOVUE-300) INJECTION 61% COMPARISON:  Radiographs 09/01/2014 and 10/19/2015 FINDINGS: The right total hip arthroplasty appears intact. No evidence of hardware failure. No fracture or dislocation. There is subchondral cystic change of the acetabulum which was visible on the preoperative radiographs of 09/01/2014. No destructive bone lesion is evident. No significant soft tissue abnormalities evident. No abnormal fluid collection. No acute inflammatory changes are evident. IMPRESSION: No significant abnormality is evident about the right total hip arthroplasty. 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 Electronically Signed   By: Ellery Plunk M.D.   On: 03/21/2016 03:34   I have personally reviewed and evaluated these images and lab results as part of my medical decision-making.    MDM   Final diagnoses:  Swelling  Pain in right thigh   New Prescriptions   CYCLOBENZAPRINE (FLEXERIL) 10 MG TABLET    Take 1 tablet (10 mg total) by  mouth 3 (three) times daily as needed (muscle soreness).   NAPROXEN (NAPROSYN) 500 MG TABLET    Take 1 po BID with food prn pain    Plan discharge  Devoria Albe, MD, Concha Pyo, MD 03/21/16 [phone]

## 2016-03-21 NOTE — Discharge Instructions (Signed)
Use ice and heat over your painful thigh. Take the medication as prescribed. Please follow up with Dr Magnus IvanBlackman about your continuing pain after your hip replacement.    Heat Therapy Heat therapy can help ease sore, stiff, injured, and tight muscles and joints. Heat relaxes your muscles, which may help ease your pain. Heat therapy should only be used on old, pre-existing, or long-lasting (chronic) injuries. Do not use heat therapy unless told by your doctor. HOW TO USE HEAT THERAPY There are several different kinds of heat therapy, including:  Moist heat pack.  Warm water bath.  Hot water bottle.  Electric heating pad.  Heated gel pack.  Heated wrap.  Electric heating pad. GENERAL HEAT THERAPY RECOMMENDATIONS   Do not sleep while using heat therapy. Only use heat therapy while you are awake.  Your skin may turn pink while using heat therapy. Do not use heat therapy if your skin turns red.  Do not use heat therapy if you have new pain.  High heat or long exposure to heat can cause burns. Be careful when using heat therapy to avoid burning your skin.  Do not use heat therapy on areas of your skin that are already irritated, such as with a rash or sunburn. GET HELP IF:   You have blisters, redness, swelling (puffiness), or numbness.  You have new pain.  Your pain is worse. MAKE SURE YOU:  Understand these instructions.  Will watch your condition.  Will get help right away if you are not doing well or get worse.   This information is not intended to replace advice given to you by your health care provider. Make sure you discuss any questions you have with your health care provider.   Document Released: 01/23/2012 Document Revised: 11/21/2014 Document Reviewed: 12/24/2013 Elsevier Interactive Patient Education 2016 Elsevier Inc.  Cryotherapy Cryotherapy is when you put ice on your injury. Ice helps lessen pain and puffiness (swelling) after an injury. Ice works the  best when you start using it in the first 24 to 48 hours after an injury. HOME CARE  Put a dry or damp towel between the ice pack and your skin.  You may press gently on the ice pack.  Leave the ice on for no more than 10 to 20 minutes at a time.  Check your skin after 5 minutes to make sure your skin is okay.  Rest at least 20 minutes between ice pack uses.  Stop using ice when your skin loses feeling (numbness).  Do not use ice on someone who cannot tell you when it hurts. This includes small children and people with memory problems (dementia). GET HELP RIGHT AWAY IF:  You have white spots on your skin.  Your skin turns blue or pale.  Your skin feels waxy or hard.  Your puffiness gets worse. MAKE SURE YOU:   Understand these instructions.  Will watch your condition.  Will get help right away if you are not doing well or get worse.   This information is not intended to replace advice given to you by your health care provider. Make sure you discuss any questions you have with your health care provider.   Document Released: 04/18/2008 Document Revised: 01/23/2012 Document Reviewed: 06/23/2011 Elsevier Interactive Patient Education [employer] Elsevier Inc.

## 2016-03-21 NOTE — ED Notes (Signed)
Pt c/o pain and swelling to right leg; pt states she had a hip replacement x 5 months ago and is still having some pain

## 2016-04-23 ENCOUNTER — Emergency Department (HOSPITAL_COMMUNITY)
Admission: EM | Admit: 2016-04-23 | Discharge: 2016-04-23 | Disposition: A | Discharge status: Home / Self Care | Payer: Managed Care, Other (non HMO) | Attending: Emergency Medicine | Admitting: Emergency Medicine

## 2016-04-23 ENCOUNTER — Encounter (HOSPITAL_COMMUNITY): Payer: Self-pay

## 2016-04-23 DIAGNOSIS — W208XXA Other cause of strike by thrown, projected or falling object, initial encounter: Secondary | ICD-10-CM | POA: Diagnosis not present

## 2016-04-23 DIAGNOSIS — H9202 Otalgia, left ear: Secondary | ICD-10-CM | POA: Diagnosis not present

## 2016-04-23 DIAGNOSIS — I10 Essential (primary) hypertension: Secondary | ICD-10-CM | POA: Insufficient documentation

## 2016-04-23 DIAGNOSIS — Y999 Unspecified external cause status: Secondary | ICD-10-CM | POA: Diagnosis not present

## 2016-04-23 DIAGNOSIS — Y939 Activity, unspecified: Secondary | ICD-10-CM | POA: Diagnosis not present

## 2016-04-23 DIAGNOSIS — M549 Dorsalgia, unspecified: Secondary | ICD-10-CM | POA: Insufficient documentation

## 2016-04-23 DIAGNOSIS — J45909 Unspecified asthma, uncomplicated: Secondary | ICD-10-CM | POA: Insufficient documentation

## 2016-04-23 DIAGNOSIS — K029 Dental caries, unspecified: Secondary | ICD-10-CM | POA: Insufficient documentation

## 2016-04-23 DIAGNOSIS — Y929 Unspecified place or not applicable: Secondary | ICD-10-CM | POA: Insufficient documentation

## 2016-04-23 DIAGNOSIS — F17210 Nicotine dependence, cigarettes, uncomplicated: Secondary | ICD-10-CM | POA: Diagnosis not present

## 2016-04-23 DIAGNOSIS — M1990 Unspecified osteoarthritis, unspecified site: Secondary | ICD-10-CM | POA: Diagnosis not present

## 2016-04-23 DIAGNOSIS — K047 Periapical abscess without sinus: Secondary | ICD-10-CM

## 2016-04-23 DIAGNOSIS — S0991XA Unspecified injury of ear, initial encounter: Secondary | ICD-10-CM | POA: Diagnosis present

## 2016-04-23 DIAGNOSIS — Z7982 Long term (current) use of aspirin: Secondary | ICD-10-CM | POA: Insufficient documentation

## 2016-04-23 MED ORDER — IBUPROFEN 600 MG PO TABS
600.0000 mg | ORAL_TABLET | Freq: Four times a day (QID) | ORAL | Status: DC
Start: 2016-04-23 — End: 2016-05-04

## 2016-04-23 MED ORDER — TRAMADOL HCL 50 MG PO TABS
50.0000 mg | ORAL_TABLET | Freq: Four times a day (QID) | ORAL | Status: DC | PRN
Start: 2016-04-23 — End: 2016-05-04

## 2016-04-23 MED ORDER — CLINDAMYCIN HCL 300 MG PO CAPS: 300.0000 mg | ORAL_CAPSULE | Freq: Three times a day (TID) | ORAL | Status: DC

## 2016-04-23 NOTE — Discharge Instructions (Signed)
Dental Caries °Dental caries is tooth decay. This decay can cause a hole in teeth (cavity) that can get bigger and deeper over time. °HOME CARE °· Brush and floss your teeth. Do this at least two times a day. °· Use a fluoride toothpaste. °· Use a mouth rinse if told by your dentist or doctor. °· Eat less sugary and starchy foods. Drink less sugary drinks. °· Avoid snacking often on sugary and starchy foods. Avoid sipping often on sugary drinks. °· Keep regular checkups and cleanings with your dentist. °· Use fluoride supplements if told by your dentist or doctor. °· Allow fluoride to be applied to teeth if told by your dentist or doctor. °  °This information is not intended to replace advice given to you by your health care provider. Make sure you discuss any questions you have with your health care provider. °  °Document Released: 08/09/2008 Document Revised: 11/21/2014 Document Reviewed: 11/02/2012 °Elsevier Interactive Patient Education ©2016 Elsevier Inc. ° °

## 2016-04-23 NOTE — ED Notes (Signed)
A box fell off of a pallet and fell onto my left ear and it hurts.

## 2016-04-23 NOTE — ED Notes (Signed)
Reports left ear pain for 2 days also with bad tooth on that side. Pt reports that she has a dentist. Has taken no meds today- Ed: tepid tea, ibuprofen and warm salt water swish and spit

## 2016-04-23 NOTE — ED Provider Notes (Signed)
CSN: [id number]     Arrival date & time 04/23/16  1935 History   None    Chief Complaint  Patient presents with  . Ear Injury     (Consider location/radiation/quality/duration/timing/severity/associated sxs/prior Treatment) HPI Comments: Patient is a 40 year old female who presents to the emergency department with a complaint of left ear pain.  The patient states that from 3 days ago a box fell off of palate and hit her in the left ear area. She states since that time it has been hurting. It is also of note that she has had a toothache over the last 2 or 3 days as well and it happened to be on the same side. She has not taken any medicine today, but states she from time to time uses a tapered tea, ibuprofen, and warm salt water swishes to help with tooth. She's not had any drainage from the left ear. She's not had any difficulty with hearing. There's been no high fevers reported. She has no difficulty with swallowing or speaking.  The history is provided by the patient.    Past Medical History  Diagnosis Date  . Hypertension   . Asthma   . Arthritis   . Chronic hip pain   . Chronic back pain    Past Surgical History  Procedure Laterality Date  . Tonsillectomy    . Tubal ligation    . 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 Diabetes Mother    Social History  Substance Use Topics  . Smoking status: Current Every Day Smoker -- 0.50 packs/day    Types: Cigarettes  . Smokeless tobacco: Never Used  . Alcohol Use: No   OB History    Gravida Para Term Preterm AB TAB SAB Ectopic Multiple Living   Review of Systems  HENT: Positive for dental problem and ear pain.   Musculoskeletal: Positive for back pain and arthralgias.  All other systems  reviewed and are negative.     Allergies  Review of patient's allergies indicates no known allergies.  Home Medications   Prior to Admission medications   Medication Sig Start Date End Date Taking? Authorizing Provider  albuterol (PROVENTIL HFA;VENTOLIN HFA) 108 (90 BASE) MCG/ACT inhaler Inhale 2 puffs into the lungs every 6 (six) hours as needed for wheezing or shortness of breath.    Historical Provider, MD  aspirin EC 325 MG EC tablet Take 1 tablet (325 mg total) by mouth 2 (two) times daily after a meal. 10/11/15   Kirtland Bouchard, PA-C  cyclobenzaprine (FLEXERIL) 10 MG tablet Take 1 tablet (10 mg total) by mouth 3 (three) times daily as needed (muscle soreness). 03/21/16   Devoria Albe, MD  diazepam (VALIUM) 5 MG tablet Take 1 tablet (5 mg total) by mouth every 6 (six) hours as needed for muscle spasms (spasms). 10/19/15   Marily Memos, MD  diphenhydrAMINE (BENADRYL) 25 MG tablet Take 1 tablet (25 mg total) by mouth every 6 (six) hours. 01/24/16   Tatyana Kirichenko, PA-C  famotidine (PEPCID) 20 MG tablet Take 1 tablet (20 mg total) by mouth 2 (two) times daily. 01/24/16   Tatyana Kirichenko, PA-C  lisinopril-hydrochlorothiazide (PRINZIDE,ZESTORETIC) 20-12.5 MG per tablet Take 1 tablet by mouth daily.  01/28/14   Historical Provider, MD  methocarbamol (ROBAXIN) 500 MG tablet Take 1 tablet (500 mg total) by mouth every 6 (six) hours as needed for muscle spasms. 10/11/15   Kirtland BouchardGilbert W Clark, PA-C  naproxen (NAPROSYN) 500 MG tablet Take 1 po BID with food prn pain 03/21/16   Devoria AlbeIva Knapp, MD  oxyCODONE-acetaminophen (ROXICET) 5-325 MG tablet Take 1-2 tablets by mouth every 4 (four) hours as needed for severe pain. Patient taking differently: Take 1-2 tablets by mouth every 4 (four) hours as needed for moderate pain or severe pain.  10/11/15   Kirtland BouchardGilbert W Clark, PA-C  predniSONE (DELTASONE) 10 MG tablet Take 2 tablets (20 mg total) by mouth daily. 01/24/16   Tatyana Kirichenko, PA-C  predniSONE (DELTASONE) 10  MG tablet Take 5 tab day 1, take 4 tab day 2, take 3 tab day 3, take 2 tab day 4, and take 1 tab day 5 01/24/16   Tatyana Kirichenko, PA-C  zolpidem (AMBIEN) 10 MG tablet Take 10 mg by mouth at bedtime.  06/09/14   Historical Provider, MD   BP 141/97 mmHg  Pulse 74  Temp(Src) 97.8 F (36.6 C) (Oral)  Resp 15  Ht 5\' 9"  (1.753 m)  Wt 90.719 kg  BMI 29.52 kg/m2  SpO2 99%  LMP 04/17/2016 (Approximate) Physical Exam  Constitutional: She is oriented to person, place, and time. She appears well-developed and well-nourished.  Non-toxic appearance.  HENT:  Head: Normocephalic.  Right Ear: Tympanic membrane and external ear normal.  Left Ear: Tympanic membrane and external ear normal.  There is no swelling of the external left ear. Negative battles sign. There are no pre-or postauricular nodes appreciated. There is mild increased redness of the left external auditory canal with a few scratches present. (Patient states she "digs in her ear" a lot). The right ear is normal.  There is swelling of the upper gum. There is a cavity of the upper molars. No visible abscess appreciated. There is no swelling of the posterior pharynx. There is no swelling under the tongue.  Eyes: EOM and lids are normal. Pupils are equal, round, and reactive to light.  Neck: Normal range of motion. Neck supple. Carotid bruit is not present.  Cardiovascular: Normal rate, regular rhythm, normal heart sounds, intact distal pulses and normal pulses.   Pulmonary/Chest: Breath sounds normal. No respiratory distress.  Abdominal: Soft. Bowel sounds are normal. There is no tenderness. There is no guarding.  Musculoskeletal: Normal range of motion.  Lymphadenopathy:       Head (right side): No submandibular adenopathy present.       Head (left side): No submandibular adenopathy present.    She has no cervical adenopathy.  Neurological: She is alert and oriented to person, place, and time. She has normal strength. No cranial nerve  deficit or sensory deficit.  Skin: Skin is warm and dry.  Psychiatric: She has a normal mood and affect. Her speech is normal.  Nursing note and vitals reviewed.   ED Course  Procedures (including critical care time) Labs Review Labs Reviewed - No data to display  Imaging Review No results found. I have personally reviewed and evaluated these images and lab results as part of my medical decision-making.   EKG Interpretation None      MDM  The examination favors infected dental caries causing left ear pain. No evidence of major injury as a result of the box falling on the ear at this time. Patient will be treated with clindamycin, ibuprofen, and  Ultram. The patient states she has a dentist and is trying to get the see him as soon as possible.    Final diagnoses:  None    **I have reviewed nursing notes, vital signs, and all appropriate lab and imaging results for this patient.Ivery Quale, PA-C 04/23/16 2032  Donnetta Hutching, MD 04/25/16 1700

## 2016-05-04 ENCOUNTER — Emergency Department (HOSPITAL_COMMUNITY)
Admission: EM | Admit: 2016-05-04 | Discharge: 2016-05-04 | Disposition: A | Discharge status: Home / Self Care | Payer: Managed Care, Other (non HMO) | Attending: Emergency Medicine | Admitting: Emergency Medicine

## 2016-05-04 ENCOUNTER — Encounter (HOSPITAL_COMMUNITY): Payer: Self-pay

## 2016-05-04 ENCOUNTER — Emergency Department (HOSPITAL_COMMUNITY): Payer: Managed Care, Other (non HMO)

## 2016-05-04 DIAGNOSIS — J45909 Unspecified asthma, uncomplicated: Secondary | ICD-10-CM | POA: Diagnosis not present

## 2016-05-04 DIAGNOSIS — Z79899 Other long term (current) drug therapy: Secondary | ICD-10-CM | POA: Insufficient documentation

## 2016-05-04 DIAGNOSIS — F17210 Nicotine dependence, cigarettes, uncomplicated: Secondary | ICD-10-CM | POA: Insufficient documentation

## 2016-05-04 DIAGNOSIS — M1711 Unilateral primary osteoarthritis, right knee: Secondary | ICD-10-CM | POA: Diagnosis not present

## 2016-05-04 DIAGNOSIS — I10 Essential (primary) hypertension: Secondary | ICD-10-CM | POA: Diagnosis not present

## 2016-05-04 DIAGNOSIS — M25561 Pain in right knee: Secondary | ICD-10-CM | POA: Diagnosis present

## 2016-05-04 DIAGNOSIS — Z7982 Long term (current) use of aspirin: Secondary | ICD-10-CM | POA: Insufficient documentation

## 2016-05-04 MED ORDER — TRAMADOL HCL 50 MG PO TABS: 50.0000 mg | ORAL_TABLET | Freq: Four times a day (QID) | ORAL | Status: DC | PRN

## 2016-05-04 MED ORDER — NAPROXEN 500 MG PO TABS: 500.0000 mg | ORAL_TABLET | Freq: Two times a day (BID) | ORAL | Status: DC

## 2016-05-04 NOTE — ED Notes (Signed)
Pt made aware to return if symptoms worsen or if any life threatening symptoms occur.   

## 2016-05-04 NOTE — ED Notes (Signed)
Pt had swelling a few days ago in knee extending down to foot, no swelling at this time, sensation and pulses intact at this time.

## 2016-05-04 NOTE — ED Notes (Signed)
Pt reports that she had swelling in right knee and down leg on Sunday which has  resolved, now has pain in right knee

## 2016-05-04 NOTE — Discharge Instructions (Signed)
  Arthritis Arthritis means joint pain. It can also mean joint disease. A joint is a place where bones come together. People who have arthritis may have:  Red joints.  Swollen joints.  Stiff joints.  Warm joints.  A fever.  A feeling of being sick. HOME CARE Pay attention to any changes in your symptoms. Take these actions to help with your pain and swelling. Medicines  Take over-the-counter and prescription medicines only as told by your doctor.  Do not take aspirin for pain if your doctor says that you may have gout. Activities  Rest your joint if your doctor tells you to.  Avoid activities that make the pain worse.  Exercise your joint regularly as told by your doctor. Try doing exercises like:  Swimming.  Water aerobics.  Biking.  Walking. Joint Care  If your joint is swollen, keep it raised (elevated) if told by your doctor.  If your joint feels stiff in the morning, try taking a warm shower.  If you have diabetes, do not apply heat without asking your doctor.  If told, apply heat to the joint:  Put a towel between the joint and the hot pack or heating pad.  Leave the heat on the area for 20-30 minutes.  If told, apply ice to the joint:  Put ice in a plastic bag.  Place a towel between your skin and the bag.  Leave the ice on for 20 minutes, 2-3 times per day.  Keep all follow-up visits as told by your doctor. GET HELP IF:  The pain gets worse.  You have a fever. GET HELP RIGHT AWAY IF:  You have very bad pain in your joint.  You have swelling in your joint.  Your joint is red.  Many joints become painful and swollen.  You have very bad back pain.  Your leg is very weak.  You cannot control your pee (urine) or poop (stool).   This information is not intended to replace advice given to you by your health care provider. Make sure you discuss any questions you have with your health care provider.   Document Released: 01/25/2010  Document Revised: 07/22/2015 Document Reviewed: 01/26/2015 Elsevier Interactive Patient Education 2016 [employer]  

## 2016-05-06 NOTE — ED Provider Notes (Signed)
CSN: [id number]     Arrival date & time 05/04/16  1254 History   First MD Initiated Contact with Patient 05/04/16 1414     Chief Complaint  Patient presents with  . Knee Pain     (Consider location/radiation/quality/duration/timing/severity/associated sxs/prior Treatment) HPI   Mariah Bell is a 40 y.o. female who presents to the Emergency Department complaining of right knee pain for few days.  She states that she noticed swelling to the front of her right lower leg three days ago that has since resolved.  She reports pain to the front of her right knee.  Pain worse with bending.  She has not tried any medications or therapies.  She denies fever, redness, current swelling or known injury.   Past Medical History  Diagnosis Date  . Hypertension   . Asthma   . Arthritis   . Chronic hip pain   . Chronic back pain    Past Surgical History  Procedure Laterality Date  . Tonsillectomy    . Tubal ligation    . 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 Diabetes Mother    Social History  Substance Use Topics  . Smoking status: Current Every Day Smoker -- 0.50 packs/day    Types: Cigarettes  . Smokeless tobacco: Never Used  . Alcohol Use: No   OB History    Gravida Para Term Preterm AB TAB SAB Ectopic Multiple Living   3 2 1 1 1 1          Review of Systems  Constitutional: Negative for fever and chills.  Genitourinary: Negative for dysuria and difficulty urinating.  Musculoskeletal: Positive for arthralgias (right knee pain). Negative for joint swelling.  Skin: Negative for color change and wound.  Neurological: Negative for weakness and numbness.  All other systems reviewed and are negative.     Allergies  Review of patient's allergies indicates no known  allergies.  Home Medications   Prior to Admission medications   Medication Sig Start Date End Date Taking? Authorizing Provider  albuterol (PROVENTIL HFA;VENTOLIN HFA) 108 (90 BASE) MCG/ACT inhaler Inhale 2 puffs into the lungs every 6 (six) hours as needed for wheezing or shortness of breath.    Historical Provider, MD  aspirin EC 325 MG EC tablet Take 1 tablet (325 mg total) by mouth 2 (two) times daily after a meal. 10/11/15   Kirtland BouchardGilbert W Clark, PA-C  clindamycin (CLEOCIN) 300 MG capsule Take 1 capsule (300 mg total) by mouth 3 (three) times daily. Take with food 04/23/16   Ivery QualeHobson Bryant, PA-C  cyclobenzaprine (FLEXERIL) 10 MG tablet Take 1 tablet (10 mg total) by mouth 3 (three) times daily as needed (muscle soreness). 03/21/16   Devoria AlbeIva Knapp, MD  diazepam (VALIUM) 5 MG tablet Take 1 tablet (5 mg total) by mouth every 6 (six) hours as needed for muscle spasms (spasms). 10/19/15   Marily MemosJason Mesner, MD  diphenhydrAMINE (BENADRYL) 25 MG tablet Take 1 tablet (25 mg total) by mouth every 6 (six) hours. 01/24/16   Tatyana Kirichenko, PA-C  famotidine (PEPCID) 20 MG tablet Take 1 tablet (20 mg total) by mouth 2 (two) times daily. 01/24/16   Tatyana Kirichenko, PA-C  lisinopril-hydrochlorothiazide (PRINZIDE,ZESTORETIC) 20-12.5 MG per tablet Take 1 tablet by mouth daily.  01/28/14   Historical Provider, MD  methocarbamol (ROBAXIN) 500 MG tablet Take 1 tablet (500 mg total) by mouth every 6 (six) hours as needed for muscle spasms. 10/11/15   Kirtland BouchardGilbert W Clark, PA-C  naproxen (NAPROSYN) 500 MG tablet Take 1 tablet (500 mg total) by mouth 2 (two) times daily with a meal. 05/04/16   Denton Derks, PA-C  oxyCODONE-acetaminophen (ROXICET) 5-325 MG tablet Take 1-2 tablets by mouth every 4 (four) hours as needed for severe pain. Patient taking differently: Take 1-2 tablets by mouth every 4 (four) hours as needed for moderate pain or severe pain.  10/11/15   Kirtland BouchardGilbert W Clark, PA-C  predniSONE (DELTASONE) 10 MG tablet Take 2  tablets (20 mg total) by mouth daily. 01/24/16   Tatyana Kirichenko, PA-C  predniSONE (DELTASONE) 10 MG tablet Take 5 tab day 1, take 4 tab day 2, take 3 tab day 3, take 2 tab day 4, and take 1 tab day 5 01/24/16   Tatyana Kirichenko, PA-C  traMADol (ULTRAM) 50 MG tablet Take 1 tablet (50 mg total) by mouth every 6 (six) hours as needed. 05/04/16   Kaleiyah Polsky, PA-C  zolpidem (AMBIEN) 10 MG tablet Take 10 mg by mouth at bedtime.  06/09/14   Historical Provider, MD   BP 156/97 mmHg  Pulse 59  Temp(Src) 98.5 F (36.9 C) (Oral)  Resp 20  Ht 5\' 9"  (1.753 m)  Wt 90.719 kg  BMI 29.52 kg/m2  SpO2 100%  LMP 04/17/2016 (Approximate) Physical Exam  Constitutional: She is oriented to person, place, and time. She appears well-developed and well-nourished. No distress.  Cardiovascular: Normal rate, regular rhythm and intact distal pulses.   Pulmonary/Chest: Effort normal and breath sounds normal.  Musculoskeletal: She exhibits tenderness. She exhibits no edema.  ttp of the anterior right knee.  No erythema, effusion, or step-off deformity.  DP pulse brisk, distal sensation intact. Calf is soft and NT. No LE edema.  Neurological: She is alert and oriented to person, place, and time. She exhibits normal muscle tone. Coordination normal.  Skin: Skin is warm and dry. No erythema.  Nursing note and vitals reviewed.   ED Course  Procedures (including critical care time) Labs Review Labs Reviewed - No data to display  Imaging Review Dg Knee Complete 4 Views Right  05/04/2016  CLINICAL DATA:  RIGHT KNEE PAIN SINCE Sunday, NO KNOWN INJURY EXAM: RIGHT KNEE - COMPLETE 4+ VIEW COMPARISON:  None. FINDINGS: No acute fracture or dislocation. No joint effusion. Mild degenerative irregularity about the patella and femoral condyles. There is also osseous irregularity involving the medial tibial spine. IMPRESSION: Mild osteoarthritis.  No acute superimposed process. Electronically Signed   By: Jeronimo GreavesKyle  Talbot M.D.    On: 05/04/2016 14:54   I have personally reviewed and evaluated these images and lab results as part of my medical decision-making.   EKG Interpretation None      MDM   Final diagnoses:  Osteoarthritis of right knee, unspecified osteoarthritis type   Pt well appearing, ambulatory with steady gait.  No concerning sx's for septic joint.  Agrees to ortho f/u if not improving.    Ace wrap applied for support and comfort.  rx for naprosyn.      Pauline Ausammy Merrin Mcvicker, PA-C 05/06/16 1317  Loren Raceravid Yelverton, MD 05/07/16 [phone]

## 2016-06-19 ENCOUNTER — Emergency Department (HOSPITAL_COMMUNITY)
Admission: EM | Admit: 2016-06-19 | Discharge: 2016-06-19 | Disposition: A | Discharge status: Home / Self Care | Payer: Managed Care, Other (non HMO) | Attending: Emergency Medicine | Admitting: Emergency Medicine

## 2016-06-19 ENCOUNTER — Emergency Department (HOSPITAL_COMMUNITY): Payer: Managed Care, Other (non HMO)

## 2016-06-19 ENCOUNTER — Encounter (HOSPITAL_COMMUNITY): Payer: Self-pay | Admitting: Emergency Medicine

## 2016-06-19 DIAGNOSIS — F17210 Nicotine dependence, cigarettes, uncomplicated: Secondary | ICD-10-CM | POA: Diagnosis not present

## 2016-06-19 DIAGNOSIS — Z7982 Long term (current) use of aspirin: Secondary | ICD-10-CM | POA: Diagnosis not present

## 2016-06-19 DIAGNOSIS — Y939 Activity, unspecified: Secondary | ICD-10-CM | POA: Insufficient documentation

## 2016-06-19 DIAGNOSIS — X58XXXA Exposure to other specified factors, initial encounter: Secondary | ICD-10-CM | POA: Insufficient documentation

## 2016-06-19 DIAGNOSIS — J45909 Unspecified asthma, uncomplicated: Secondary | ICD-10-CM | POA: Insufficient documentation

## 2016-06-19 DIAGNOSIS — I10 Essential (primary) hypertension: Secondary | ICD-10-CM | POA: Diagnosis not present

## 2016-06-19 DIAGNOSIS — Y929 Unspecified place or not applicable: Secondary | ICD-10-CM | POA: Diagnosis not present

## 2016-06-19 DIAGNOSIS — Z79899 Other long term (current) drug therapy: Secondary | ICD-10-CM | POA: Diagnosis not present

## 2016-06-19 DIAGNOSIS — Y999 Unspecified external cause status: Secondary | ICD-10-CM | POA: Diagnosis not present

## 2016-06-19 DIAGNOSIS — S96911A Strain of unspecified muscle and tendon at ankle and foot level, right foot, initial encounter: Secondary | ICD-10-CM | POA: Diagnosis not present

## 2016-06-19 DIAGNOSIS — M25571 Pain in right ankle and joints of right foot: Secondary | ICD-10-CM | POA: Diagnosis present

## 2016-06-19 MED ORDER — NAPROXEN 500 MG PO TABS: 500.0000 mg | ORAL_TABLET | Freq: Two times a day (BID) | ORAL | 0 refills | Status: DC

## 2016-06-19 MED ORDER — HYDROCHLOROTHIAZIDE 25 MG PO TABS: 12.5000 mg | ORAL_TABLET | Freq: Every day | ORAL | Status: DC

## 2016-06-19 MED ORDER — HYDROCHLOROTHIAZIDE 25 MG PO TABS
ORAL_TABLET | ORAL | Status: AC
  Administered 2016-06-19: 12.5 mg
  Filled 2016-06-19: qty 1

## 2016-06-19 MED ORDER — LISINOPRIL 10 MG PO TABS
20.0000 mg | ORAL_TABLET | Freq: Once | ORAL | Status: AC
  Administered 2016-06-19: 20 mg via ORAL
  Filled 2016-06-19: qty 2

## 2016-06-19 NOTE — Discharge Instructions (Signed)
Keep leg elevated.  Follow up with your md this week

## 2016-06-19 NOTE — ED Triage Notes (Signed)
Patient c/o right ankle pain and swelling. Per patient started Friday. Denies any injury. Patient states tried elevation with no relief. Denies taking any medication for pain.

## 2016-06-19 NOTE — ED Provider Notes (Signed)
[facility]PT Provider Note   CSN: [id number] Arrival date & time: 06/19/16  0740  First Provider Contact:  8:01 AM     By signing my name below, I, Placido SouLogan Joldersma, attest that this documentation has been prepared under the direction and in the presence of Bethann BerkshireJoseph Myrka Sylva, MD. Electronically Signed: Placido SouLogan Joldersma, ED Scribe. 06/19/16. 8:11 AM.  History   Chief Complaint Chief Complaint  Patient presents with  . Ankle Pain    HPI HPI Comments: Mariah Bell is a 40 y.o. female who presents to the Emergency Department complaining of worsening, mild, atraumatic, right calf and ankle swelling x 2 days. Pt reports associated, mild, pain throughout the region that presents with movement and palpation. Pt denies having taken her rx HTN medications today which she typically takes in the mornings. She denies any other associated symptoms at this time.   Patient complains of pain in right ankle   The history is provided by the patient. No language interpreter was used.  Foot Pain  This is a new problem. The current episode started 12 to 24 hours ago. The problem occurs constantly. The problem has not changed since onset.Pertinent negatives include no chest pain, no abdominal pain and no headaches. Exacerbated by: Movement. Nothing relieves the symptoms.    Past Medical History:  Diagnosis Date  . Arthritis   . Asthma   . Chronic back pain   . Chronic hip pain   . Hypertension     Patient Active Problem List   Diagnosis Date Noted  . Avascular necrosis of bone of right hip (HCC) 10/09/2015  . 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 TONSILLECTOMY    . TOTAL HIP ARTHROPLASTY Right 10/09/2015   Procedure: RIGHT TOTAL HIP ARTHROPLASTY ANTERIOR APPROACH;  Surgeon: Kathryne Hitchhristopher Y Blackman, MD;  Location: [facility];  Service: Orthopedics;  Laterality: Right;  . TUBAL LIGATION      OB History    Gravida Para  Term Preterm AB Living   3 2 1 1 1      SAB TAB Ectopic Multiple Live Births     1             Home Medications    Prior to Admission medications   Medication Sig Start Date End Date Taking? Authorizing Provider  albuterol (PROVENTIL HFA;VENTOLIN HFA) 108 (90 BASE) MCG/ACT inhaler Inhale 2 puffs into the lungs every 6 (six) hours as needed for wheezing or shortness of breath.    Historical Provider, MD  aspirin EC 325 MG EC tablet Take 1 tablet (325 mg total) by mouth 2 (two) times daily after a meal. 10/11/15   Kirtland BouchardGilbert W Clark, PA-C  clindamycin (CLEOCIN) 300 MG capsule Take 1 capsule (300 mg total) by mouth 3 (three) times daily. Take with food 04/23/16   Ivery QualeHobson Bryant, PA-C  cyclobenzaprine (FLEXERIL) 10 MG tablet Take 1 tablet (10 mg total) by mouth 3 (three) times daily as needed (muscle soreness). 03/21/16   Devoria AlbeIva Knapp, MD  diazepam (VALIUM) 5 MG tablet Take 1 tablet (5 mg total) by mouth every 6 (six) hours as needed for muscle spasms (spasms). 10/19/15   Marily MemosJason Mesner, MD  diphenhydrAMINE (BENADRYL) 25 MG tablet Take 1 tablet (25 mg total) by mouth every 6 (six) hours. 01/24/16   Tatyana Kirichenko, PA-C  famotidine (PEPCID) 20 MG tablet Take 1 tablet (20 mg total) by mouth 2 (two) times daily. 01/24/16  Tatyana Kirichenko, PA-C  lisinopril-hydrochlorothiazide (PRINZIDE,ZESTORETIC) 20-12.5 MG per tablet Take 1 tablet by mouth daily.  01/28/14   Historical Provider, MD  methocarbamol (ROBAXIN) 500 MG tablet Take 1 tablet (500 mg total) by mouth every 6 (six) hours as needed for muscle spasms. 10/11/15   Kirtland Bouchard, PA-C  naproxen (NAPROSYN) 500 MG tablet Take 1 tablet (500 mg total) by mouth 2 (two) times daily with a meal. 05/04/16   Tammy Triplett, PA-C  oxyCODONE-acetaminophen (ROXICET) 5-325 MG tablet Take 1-2 tablets by mouth every 4 (four) hours as needed for severe pain. Patient taking differently: Take 1-2 tablets by mouth every 4 (four) hours as needed for moderate pain or severe  pain.  10/11/15   Kirtland Bouchard, PA-C  predniSONE (DELTASONE) 10 MG tablet Take 2 tablets (20 mg total) by mouth daily. 01/24/16   Tatyana Kirichenko, PA-C  predniSONE (DELTASONE) 10 MG tablet Take 5 tab day 1, take 4 tab day 2, take 3 tab day 3, take 2 tab day 4, and take 1 tab day 5 01/24/16   Tatyana Kirichenko, PA-C  traMADol (ULTRAM) 50 MG tablet Take 1 tablet (50 mg total) by mouth every 6 (six) hours as needed. 05/04/16   Tammy Triplett, PA-C  zolpidem (AMBIEN) 10 MG tablet Take 10 mg by mouth at bedtime.  06/09/14   Historical Provider, MD    Family History Family History  Problem Relation Age of Onset  . Diabetes Mother     Social History Social History  Substance Use Topics  . Smoking status: Current Every Day Smoker    Packs/day: 0.50    Types: Cigarettes  . Smokeless tobacco: Never Used  . Alcohol use No     Allergies   Review of patient's allergies indicates no known allergies.   Review of Systems Review of Systems  Constitutional: Negative for appetite change and fatigue.  HENT: Negative for congestion, ear discharge and sinus pressure.   Eyes: Negative for discharge.  Respiratory: Negative for cough.   Cardiovascular: Negative for chest pain.  Gastrointestinal: Negative for abdominal pain and diarrhea.  Genitourinary: Negative for frequency and hematuria.  Musculoskeletal: Positive for arthralgias, joint swelling and myalgias. Negative for back pain.  Skin: Negative for color change and rash.  Neurological: Negative for seizures and headaches.  Psychiatric/Behavioral: Negative for hallucinations.   Physical Exam Updated Vital Signs BP (S) (!) 161/106 (BP Location: Right Arm) Comment: Patient states "that's normal for me."   Pulse 70   Temp 98 F (36.7 C) (Oral)   Resp 16   Ht  (1.753 m)   Wt 200 lb (90.7 kg)   LMP 06/05/2016   SpO2 100%   BMI 29.53 kg/m   Physical Exam  Constitutional: She is oriented to person, place, and time. She appears  well-developed.  HENT:  Head: Normocephalic.  Eyes: Conjunctivae are normal.  Neck: No tracheal deviation present.  Cardiovascular:  No murmur heard. Musculoskeletal: Normal range of motion. She exhibits edema and tenderness.  Minimal swelling to the right calf w/o tenderness. Minimal tenderness to the medial and lateral aspects of the right ankle.   Neurological: She is oriented to person, place, and time.  Skin: Skin is warm.  Psychiatric: She has a normal mood and affect.    ED Treatments / Results  Labs (all labs ordered are listed, but only abnormal results are displayed) Labs Reviewed - No data to display  EKG  EKG Interpretation None       Radiology  Dg Ankle Complete Right  Result Date: 06/19/2016 CLINICAL DATA:  Right ankle pain.  No trauma. EXAM: RIGHT ANKLE - COMPLETE 3+ VIEW COMPARISON:  None. FINDINGS: Bilateral soft tissue swelling with no fracture or dislocation. IMPRESSION: Bilateral soft tissue swelling with no underlying etiology identified. Electronically Signed   By: Gerome Sam III M.D   On: 06/19/2016 08:32    Procedures Procedures  DIAGNOSTIC STUDIES: Oxygen Saturation is 100% on RA, normal by my interpretation.    COORDINATION OF CARE: 8:11 AM Discussed next steps with pt. Pt verbalized understanding and is agreeable with the plan.   Medications Ordered in ED Medications - No data to display   Initial Impression / Assessment and Plan / ED Course  I have reviewed the triage vital signs and the nursing notes.  Pertinent labs & imaging results that were available during my care of the patient were reviewed by me and considered in my medical decision making (see chart for details).  Clinical Course    X-ray unremarkable. Patient inflammation around her ankle. Mild swelling to calf doubt DVT because is not tender at all. Will place patient on Naprosyn for her inflamed ankle and she'll follow-up with PCP The chart was scribed for me under my  direct supervision.  I personally performed the history, physical, and medical decision making and all procedures in the evaluation of this patient..  Final Clinical Impressions(s) / ED Diagnoses   Final diagnoses:  None    New Prescriptions New Prescriptions   No medications on file     Bethann Berkshire, MD 06/19/16 [phone]

## 2016-06-19 NOTE — ED Notes (Signed)
edp aware of bp, instructed pt to follow up with pcp.

## 2016-09-11 ENCOUNTER — Encounter (HOSPITAL_COMMUNITY): Payer: Self-pay | Admitting: *Deleted

## 2016-09-11 ENCOUNTER — Emergency Department (HOSPITAL_COMMUNITY)
Admission: EM | Admit: 2016-09-11 | Discharge: 2016-09-11 | Disposition: A | Discharge status: Home / Self Care | Payer: Managed Care, Other (non HMO) | Attending: Emergency Medicine | Admitting: Emergency Medicine

## 2016-09-11 DIAGNOSIS — M545 Low back pain, unspecified: Secondary | ICD-10-CM

## 2016-09-11 DIAGNOSIS — Z7982 Long term (current) use of aspirin: Secondary | ICD-10-CM | POA: Insufficient documentation

## 2016-09-11 DIAGNOSIS — F17210 Nicotine dependence, cigarettes, uncomplicated: Secondary | ICD-10-CM | POA: Insufficient documentation

## 2016-09-11 DIAGNOSIS — I10 Essential (primary) hypertension: Secondary | ICD-10-CM | POA: Insufficient documentation

## 2016-09-11 DIAGNOSIS — Z79899 Other long term (current) drug therapy: Secondary | ICD-10-CM | POA: Insufficient documentation

## 2016-09-11 DIAGNOSIS — J45909 Unspecified asthma, uncomplicated: Secondary | ICD-10-CM | POA: Insufficient documentation

## 2016-09-11 MED ORDER — HYDROCODONE-ACETAMINOPHEN 5-325 MG PO TABS: 1.0000 | ORAL_TABLET | Freq: Four times a day (QID) | ORAL | 0 refills | Status: DC | PRN

## 2016-09-11 MED ORDER — HYDROMORPHONE HCL 1 MG/ML IJ SOLN
1.0000 mg | Freq: Once | INTRAMUSCULAR | Status: AC
  Administered 2016-09-11: 1 mg via INTRAMUSCULAR
  Filled 2016-09-11: qty 1

## 2016-09-11 MED ORDER — KETOROLAC TROMETHAMINE 60 MG/2ML IM SOLN
60.0000 mg | Freq: Once | INTRAMUSCULAR | Status: AC
  Administered 2016-09-11: 60 mg via INTRAMUSCULAR
  Filled 2016-09-11: qty 2

## 2016-09-11 MED ORDER — IBUPROFEN 800 MG PO TABS: 800.0000 mg | ORAL_TABLET | Freq: Three times a day (TID) | ORAL | 0 refills | Status: DC | PRN

## 2016-09-11 NOTE — ED Provider Notes (Signed)
By signing my name below, I, Modena JanskyAlbert Thayil, attest that this documentation has been prepared under the direction and in the presence of Mersadies Petree N Zaleah Ternes, DO . Electronically Signed: Modena JanskyAlbert Thayil, Scribe. 09/11/2016. 12:40 AM.  TIME SEEN: 12:40 AM  CHIEF COMPLAINT: Chest pain  HPI:  HPI Comments: Mariah PickerelMelissa R Bell is a 40 y.o. female with history of hypertension, chronic back and hip pain who presents to the Emergency Department complaining of constant moderate right lower back pain that started about a week ago. She describes the pain as exacerbated by movement, and it is unrelieved by aleve and flexeril.  She denies any recent trauma, fever, chills, bowel/bladder incontinence, urinary retention. No numbness, tingling or focal weakness. No history of diabetes, HIV, cancer. No night sweats, unexplained weight loss. No dysuria or hematuria.    PCP: [facility]   ROS: See HPI Constitutional: no fever, no chills  Eyes: no drainage  ENT: no runny nose   Cardiovascular: no chest pain  Resp: no SOB  GI: no vomiting GU: no dysuria Integumentary: no rash  Allergy: no hives  Musculoskeletal: +back pain, no leg swelling  Neurological: no slurred speech ROS otherwise negative  PAST MEDICAL HISTORY/PAST SURGICAL HISTORY:  Past Medical History:  Diagnosis Date  . Arthritis   . Asthma   . Chronic back pain   . Chronic hip pain   . Hypertension     MEDICATIONS:  Prior to Admission medications   Medication Sig Start Date End Date Taking? Authorizing Provider  albuterol (PROVENTIL HFA;VENTOLIN HFA) 108 (90 BASE) MCG/ACT inhaler Inhale 2 puffs into the lungs every 6 (six) hours as needed for wheezing or shortness of breath.    Historical Provider, MD  aspirin EC 325 MG EC tablet Take 1 tablet (325 mg total) by mouth 2 (two) times daily after a meal. 10/11/15   Kirtland BouchardGilbert W Clark, PA-C  clindamycin (CLEOCIN) 300 MG capsule Take 1 capsule (300 mg total) by mouth 3 (three) times  daily. Take with food 04/23/16   Ivery QualeHobson Bryant, PA-C  cyclobenzaprine (FLEXERIL) 10 MG tablet Take 1 tablet (10 mg total) by mouth 3 (three) times daily as needed (muscle soreness). 03/21/16   Devoria AlbeIva Knapp, MD  diazepam (VALIUM) 5 MG tablet Take 1 tablet (5 mg total) by mouth every 6 (six) hours as needed for muscle spasms (spasms). 10/19/15   Marily MemosJason Mesner, MD  diphenhydrAMINE (BENADRYL) 25 MG tablet Take 1 tablet (25 mg total) by mouth every 6 (six) hours. 01/24/16   Tatyana Kirichenko, PA-C  famotidine (PEPCID) 20 MG tablet Take 1 tablet (20 mg total) by mouth 2 (two) times daily. 01/24/16   Tatyana Kirichenko, PA-C  lisinopril-hydrochlorothiazide (PRINZIDE,ZESTORETIC) 20-12.5 MG per tablet Take 1 tablet by mouth daily.  01/28/14   Historical Provider, MD  methocarbamol (ROBAXIN) 500 MG tablet Take 1 tablet (500 mg total) by mouth every 6 (six) hours as needed for muscle spasms. 10/11/15   Kirtland BouchardGilbert W Clark, PA-C  naproxen (NAPROSYN) 500 MG tablet Take 1 tablet (500 mg total) by mouth 2 (two) times daily. 06/19/16   Bethann BerkshireJoseph Zammit, MD  oxyCODONE-acetaminophen (ROXICET) 5-325 MG tablet Take 1-2 tablets by mouth every 4 (four) hours as needed for severe pain. Patient taking differently: Take 1-2 tablets by mouth every 4 (four) hours as needed for moderate pain or severe pain.  10/11/15   Kirtland BouchardGilbert W Clark, PA-C  predniSONE (DELTASONE) 10 MG tablet Take 2 tablets (20 mg total) by mouth daily. 01/24/16   Jaynie Crumbleatyana Kirichenko, PA-C  predniSONE (DELTASONE) 10 MG tablet Take 5 tab day 1, take 4 tab day 2, take 3 tab day 3, take 2 tab day 4, and take 1 tab day 5 01/24/16   Tatyana Kirichenko, PA-C  traMADol (ULTRAM) 50 MG tablet Take 1 tablet (50 mg total) by mouth every 6 (six) hours as needed. 05/04/16   Tammy Triplett, PA-C  zolpidem (AMBIEN) 10 MG tablet Take 10 mg by mouth at bedtime.  06/09/14   Historical Provider, MD    ALLERGIES:  No Known Allergies  SOCIAL HISTORY:  Social History  Substance Use Topics  . Smoking  status: Current Every Day Smoker    Packs/day: 0.50    Types: Cigarettes  . Smokeless tobacco: Never Used  . Alcohol use No    FAMILY HISTORY: Family History  Problem Relation Age of Onset  . Diabetes Mother     EXAM: BP 159/88 (BP Location: Left Arm)   Pulse 79   Temp 97.9 F (36.6 C) (Oral)   Resp 18   Ht 5\' 9"  (1.753 m)   Wt 200 lb (90.7 kg)   LMP 07/19/2016 (Approximate)   SpO2 100%   BMI 29.53 kg/m  CONSTITUTIONAL: Alert and oriented and responds appropriately to questions. Well-appearing; well-nourished HEAD: Normocephalic EYES: Conjunctivae clear, PERRL ENT: normal nose; no rhinorrhea; moist mucous membranes NECK: Supple, no meningismus, no LAD  CARD: RRR; S1 and S2 appreciated; no murmurs, no clicks, no rubs, no gallops RESP: Normal chest excursion without splinting or tachypnea; breath sounds clear and equal bilaterally; no wheezes, no rhonchi, no rales, no hypoxia or respiratory distress, speaking full sentences ABD/GI: Normal bowel sounds; non-distended; soft, non-tender, no rebound, no guarding, no peritoneal signs BACK:  The back appears normal and is tender to palpation over the right lumbar paraspinal musculature without erythema, warmth, induration or fluctuance. No rashes or lesions noted. No midline spinal tenderness or step-off or deformity.  There is no CVA tenderness EXT: Normal ROM in all joints; non-tender to palpation; no edema; normal capillary refill; no cyanosis, no calf tenderness or swelling    SKIN: Normal color for age and race; warm; no rash NEURO: Moves all extremities equally, sensation to light touch intact diffusely, cranial nerves II through XII intact, strength 5/5 in all 4 extremity, 2+ deep tendon reflexes in bilateral upper and lower extremities, no clonus, normal gait, no saddle anesthesia PSYCH: The patient's mood and manner are appropriate. Grooming and personal hygiene are appropriate.  MEDICAL DECISION MAKING: Patient here with back  pain. Suspect muscle strain, spasm. No midline tenderness on exam or history of injury to suggest fracture. No neurologic deficits. No fever. Doubt cauda equina, epidural abscess or hematoma, discitis or ostium myelitis, transverse myelitis. I do not feel she needs emergent imaging of her back at this time. She does have a PCP for follow-up. We'll treat symptomatically with IM Toradol, Dilaudid in the emergency department. She already has muscle relaxers at home. Have advised her continue anti-inflammatories and we'll give her a prescription for hydrocodone as well. Discussed with her return precautions. She is comfortable with this plan.  At this time, I do not feel there is any life-threatening condition present. I have reviewed and discussed all results (EKG, imaging, lab, urine as appropriate), exam findings with patient/family. I have reviewed nursing notes and appropriate previous records.  I feel the patient is safe to be discharged home without further emergent workup and can continue workup as an outpatient as needed. Discussed usual and customary return  precautions. Patient/family verbalize understanding and are comfortable with this plan.  Outpatient follow-up has been provided. All questions have been answered.  I personally performed the services described in this documentation, which was scribed in my presence. The recorded information has been reviewed and is accurate.     Layla MawKristen N Malyna Budney, DO 09/11/16 [id number]

## 2016-09-11 NOTE — ED Notes (Signed)
Dr Ward in to assess 

## 2016-09-11 NOTE — ED Notes (Signed)
Pt ambulates heel to toe- She has great difficulty raising er feet onto the stretcher. But then easily crosses her legs

## 2016-09-11 NOTE — ED Notes (Signed)
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 She has Dr Carolan ClinesAlisan as her family physician and Dr Rayburn MaBlackmon as her ortho

## 2016-09-11 NOTE — ED Triage Notes (Signed)
Pt reports right lower back pain that started about a week ago. Denies any new injury or activity.

## 2017-03-29 ENCOUNTER — Encounter (HOSPITAL_COMMUNITY): Payer: Self-pay | Admitting: Emergency Medicine

## 2017-03-29 ENCOUNTER — Emergency Department (HOSPITAL_COMMUNITY): Payer: Self-pay

## 2017-03-29 ENCOUNTER — Emergency Department (HOSPITAL_COMMUNITY)
Admission: EM | Admit: 2017-03-29 | Discharge: 2017-03-29 | Disposition: A | Discharge status: Home / Self Care | Payer: Self-pay | Attending: Emergency Medicine | Admitting: Emergency Medicine

## 2017-03-29 DIAGNOSIS — Z79899 Other long term (current) drug therapy: Secondary | ICD-10-CM | POA: Insufficient documentation

## 2017-03-29 DIAGNOSIS — I10 Essential (primary) hypertension: Secondary | ICD-10-CM | POA: Insufficient documentation

## 2017-03-29 DIAGNOSIS — Z96649 Presence of unspecified artificial hip joint: Secondary | ICD-10-CM

## 2017-03-29 DIAGNOSIS — F17210 Nicotine dependence, cigarettes, uncomplicated: Secondary | ICD-10-CM | POA: Insufficient documentation

## 2017-03-29 DIAGNOSIS — J45909 Unspecified asthma, uncomplicated: Secondary | ICD-10-CM | POA: Insufficient documentation

## 2017-03-29 DIAGNOSIS — T84030A Mechanical loosening of internal right hip prosthetic joint, initial encounter: Secondary | ICD-10-CM | POA: Insufficient documentation

## 2017-03-29 DIAGNOSIS — R9389 Abnormal findings on diagnostic imaging of other specified body structures: Secondary | ICD-10-CM

## 2017-03-29 DIAGNOSIS — T84038A Mechanical loosening of other internal prosthetic joint, initial encounter: Secondary | ICD-10-CM

## 2017-03-29 DIAGNOSIS — R937 Abnormal findings on diagnostic imaging of other parts of musculoskeletal system: Secondary | ICD-10-CM | POA: Insufficient documentation

## 2017-03-29 DIAGNOSIS — Z7982 Long term (current) use of aspirin: Secondary | ICD-10-CM | POA: Insufficient documentation

## 2017-03-29 DIAGNOSIS — R52 Pain, unspecified: Secondary | ICD-10-CM

## 2017-03-29 DIAGNOSIS — Y793 Surgical instruments, materials and orthopedic devices (including sutures) associated with adverse incidents: Secondary | ICD-10-CM | POA: Insufficient documentation

## 2017-03-29 DIAGNOSIS — G8929 Other chronic pain: Secondary | ICD-10-CM

## 2017-03-29 DIAGNOSIS — M25551 Pain in right hip: Secondary | ICD-10-CM

## 2017-03-29 LAB — CBC WITH DIFFERENTIAL/PLATELET
BASOS ABS: 0 10*3/uL (ref 0.0–0.1)
BASOS PCT: 0 %
EOS ABS: 0.2 10*3/uL (ref 0.0–0.7)
EOS PCT: 3 %
HCT: 40.1 % (ref 36.0–46.0)
Hemoglobin: 14 g/dL (ref 12.0–15.0)
LYMPHS PCT: 31 %
Lymphs Abs: 2.5 10*3/uL (ref 0.7–4.0)
MCH: 32.3 pg (ref 26.0–34.0)
MCHC: 34.9 g/dL (ref 30.0–36.0)
MCV: 92.4 fL (ref 78.0–100.0)
MONO ABS: 0.4 10*3/uL (ref 0.1–1.0)
Monocytes Relative: 6 %
Neutro Abs: 4.8 10*3/uL (ref 1.7–7.7)
Neutrophils Relative %: 60 %
PLATELETS: 291 10*3/uL (ref 150–400)
RBC: 4.34 MIL/uL (ref 3.87–5.11)
RDW: 13.2 % (ref 11.5–15.5)
WBC: 7.9 10*3/uL (ref 4.0–10.5)

## 2017-03-29 LAB — BASIC METABOLIC PANEL
ANION GAP: 9 (ref 5–15)
BUN: 11 mg/dL (ref 6–20)
CALCIUM: 9.2 mg/dL (ref 8.9–10.3)
CO2: 24 mmol/L (ref 22–32)
Chloride: 103 mmol/L (ref 101–111)
Creatinine, Ser: 1 mg/dL (ref 0.44–1.00)
GFR calc Af Amer: >60 mL/min (ref >60)
GLUCOSE: 104 mg/dL — AB (ref 65–99)
Potassium: 3.6 mmol/L (ref 3.5–5.1)
SODIUM: 136 mmol/L (ref 135–145)

## 2017-03-29 LAB — I-STAT BETA HCG BLOOD, ED (MC, WL, AP ONLY)

## 2017-03-29 MED ORDER — METHOCARBAMOL 500 MG PO TABS: 1000.0000 mg | ORAL_TABLET | Freq: Four times a day (QID) | ORAL | 0 refills | Status: DC | PRN

## 2017-03-29 MED ORDER — IOPAMIDOL (ISOVUE-300) INJECTION 61%
100.0000 mL | Freq: Once | INTRAVENOUS | Status: AC | PRN
  Administered 2017-03-29: 100 mL via INTRAVENOUS

## 2017-03-29 MED ORDER — TRAMADOL HCL 50 MG PO TABS: 50.0000 mg | ORAL_TABLET | Freq: Four times a day (QID) | ORAL | 0 refills | Status: DC | PRN

## 2017-03-29 MED ORDER — CYCLOBENZAPRINE HCL 10 MG PO TABS
10.0000 mg | ORAL_TABLET | Freq: Once | ORAL | Status: AC
  Administered 2017-03-29: 10 mg via ORAL
  Filled 2017-03-29: qty 1

## 2017-03-29 MED ORDER — IBUPROFEN 400 MG PO TABS
400.0000 mg | ORAL_TABLET | Freq: Once | ORAL | Status: AC
  Administered 2017-03-29: 400 mg via ORAL
  Filled 2017-03-29: qty 1

## 2017-03-29 MED ORDER — ACETAMINOPHEN 325 MG PO TABS
650.0000 mg | ORAL_TABLET | Freq: Once | ORAL | Status: AC
  Administered 2017-03-29: 650 mg via ORAL
  Filled 2017-03-29: qty 2

## 2017-03-29 MED ORDER — METHOCARBAMOL 500 MG PO TABS: 1000.0000 mg | ORAL_TABLET | Freq: Four times a day (QID) | ORAL | 0 refills | Status: DC

## 2017-03-29 NOTE — ED Provider Notes (Signed)
[facility] DEPT Provider Note   CSN: [id number] Arrival date & time: 03/29/17  1041     History   Chief Complaint Chief Complaint  Patient presents with  . Hip Pain    HPI Mariah Bell is a 41 y.o. female.  HPI Pt was seen at 1130. Per pt, c/o gradual onset and persistence of constant acute flair of her chronic right hip "pain" for the past 2 to 3 weeks. Has been associated with right sided low back pain. Pt describes the pain as "cramping."  Denies any change in her usual chronic pain pattern. Denies injury, no fevers, no rash, no focal motor weakness, no tingling/numbness in extremities, no abd pain, no hematuria/dysuria.    Past Medical History:  Diagnosis Date  . Arthritis   . Asthma   . Chronic back pain   . Chronic hip pain   . Hypertension     Patient Active Problem List   Diagnosis Date Noted  . Avascular necrosis of bone of right hip (HCC) 10/09/2015  . 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 TONSILLECTOMY    . TOTAL HIP ARTHROPLASTY Right 10/09/2015   Procedure: RIGHT TOTAL HIP ARTHROPLASTY ANTERIOR APPROACH;  Surgeon: Kathryne Hitch, MD;  Location: [facility];  Service: Orthopedics;  Laterality: Right;  . TUBAL LIGATION      OB History    Gravida Para Term Preterm AB Living   3 2 1 1 1      SAB TAB Ectopic Multiple Live Births     1             Home Medications    Prior to Admission medications   Medication Sig Start Date End Date Taking? Authorizing Provider  amLODipine (NORVASC) 5 MG tablet Take 5 mg by mouth daily.   Yes [provider]  aspirin EC 325 MG EC tablet Take 1 tablet (325 mg total) by mouth 2 (two) times daily after a meal. 10/11/15  Yes Kirtland Bouchard, PA-C  diphenhydrAMINE (BENADRYL) 25 MG tablet Take 1 tablet (25 mg total) by mouth every 6 (six) hours. 01/24/16  Yes Kirichenko, Tatyana, PA-C  famotidine (PEPCID) 20 MG tablet Take 1  tablet (20 mg total) by mouth 2 (two) times daily. 01/24/16  Yes Kirichenko, Tatyana, PA-C  ibuprofen (ADVIL,MOTRIN) 800 MG tablet Take 1 tablet (800 mg total) by mouth every 8 (eight) hours as needed for mild pain. 09/11/16  Yes Ward, Layla Maw, DO  lisinopril-hydrochlorothiazide (PRINZIDE,ZESTORETIC) 20-12.5 MG per tablet Take 1 tablet by mouth daily.  01/28/14  Yes [provider]  albuterol (PROVENTIL HFA;VENTOLIN HFA) 108 (90 BASE) MCG/ACT inhaler Inhale 2 puffs into the lungs every 6 (six) hours as needed for wheezing or shortness of breath.    [provider]  clindamycin (CLEOCIN) 300 MG capsule Take 1 capsule (300 mg total) by mouth 3 (three) times daily. Take with food Patient not taking: Reported on 03/29/2017 04/23/16   Ivery Quale, PA-C  cyclobenzaprine (FLEXERIL) 10 MG tablet Take 1 tablet (10 mg total) by mouth 3 (three) times daily as needed (muscle soreness). Patient not taking: Reported on 03/29/2017 03/21/16   Devoria Albe, MD  HYDROcodone-acetaminophen (NORCO/VICODIN) 5-325 MG tablet Take 1-2 tablets by mouth every 6 (six) hours as needed. Patient not taking: Reported on 03/29/2017 09/11/16   Ward, Layla Maw, DO  methocarbamol (ROBAXIN) 500 MG tablet Take 1 tablet (500 mg total) by mouth every  6 (six) hours as needed for muscle spasms. Patient not taking: Reported on 03/29/2017 10/11/15   Kirtland Bouchardlark, Gilbert W, PA-C  naproxen (NAPROSYN) 500 MG tablet Take 1 tablet (500 mg total) by mouth 2 (two) times daily. Patient not taking: Reported on 03/29/2017 06/19/16   Bethann BerkshireZammit, Joseph, MD  oxyCODONE-acetaminophen (ROXICET) 5-325 MG tablet Take 1-2 tablets by mouth every 4 (four) hours as needed for severe pain. Patient not taking: Reported on 03/29/2017 10/11/15   Kirtland Bouchardlark, Gilbert W, PA-C  predniSONE (DELTASONE) 10 MG tablet Take 2 tablets (20 mg total) by mouth daily. Patient not taking: Reported on 03/29/2017 01/24/16   Jaynie CrumbleKirichenko, Tatyana, PA-C  predniSONE (DELTASONE) 10 MG tablet Take 5  tab day 1, take 4 tab day 2, take 3 tab day 3, take 2 tab day 4, and take 1 tab day 5 Patient not taking: Reported on 03/29/2017 01/24/16   Jaynie CrumbleKirichenko, Tatyana, PA-C  traMADol (ULTRAM) 50 MG tablet Take 1 tablet (50 mg total) by mouth every 6 (six) hours as needed. Patient not taking: Reported on 03/29/2017 05/04/16   Triplett, Tammy, PA-C  zolpidem (AMBIEN) 10 MG tablet Take 10 mg by mouth at bedtime.  06/09/14   [provider]    Family History Family History  Problem Relation Age of Onset  . Diabetes Mother     Social History Social History  Substance Use Topics  . Smoking status: Current Every Day Smoker    Packs/day: 0.50    Types: Cigarettes  . Smokeless tobacco: Never Used  . Alcohol use No     Allergies   Patient has no known allergies.   Review of Systems Review of Systems ROS: Statement: All systems negative except as marked or noted in the HPI; Constitutional: Negative for fever and chills. ; ; Eyes: Negative for eye pain, redness and discharge. ; ; ENMT: Negative for ear pain, hoarseness, nasal congestion, sinus pressure and sore throat. ; ; Cardiovascular: Negative for chest pain, palpitations, diaphoresis, dyspnea and peripheral edema. ; ; Respiratory: Negative for cough, wheezing and stridor. ; ; Gastrointestinal: Negative for nausea, vomiting, diarrhea, abdominal pain, blood in stool, hematemesis, jaundice and rectal bleeding. . ; ; Genitourinary: Negative for dysuria, flank pain and hematuria. ; ; Musculoskeletal: +right hip pain, LBP. Negative for neck pain. Negative for swelling and trauma.; ; Skin: Negative for pruritus, rash, abrasions, blisters, bruising and skin lesion.; ; Neuro: Negative for headache, lightheadedness and neck stiffness. Negative for weakness, altered level of consciousness, altered mental status, extremity weakness, paresthesias, involuntary movement, seizure and syncope.       Physical Exam Updated Vital Signs BP (!) 150/93 (BP  Location: Right Arm)   Pulse 61   Temp 98.6 F (37 C) (Oral)   Resp 18   Wt 209 lb (94.8 kg)   LMP 03/03/2017 Comment: tubal ligation  SpO2 94%   BMI 30.86 kg/m   Physical Exam 1135: Physical examination:  Nursing notes reviewed; Vital signs and O2 SAT reviewed;  Constitutional: Well developed, Well nourished, Well hydrated, In no acute distress; Head:  Normocephalic, atraumatic; Eyes: EOMI, PERRL, No scleral icterus; ENMT: Mouth and pharynx normal, Mucous membranes moist; Neck: Supple, Full range of motion, No lymphadenopathy; Cardiovascular: Regular rate and rhythm, No gallop; Respiratory: Breath sounds clear & equal bilaterally, No wheezes.  Speaking full sentences with ease, Normal respiratory effort/excursion; Chest: Nontender, Movement normal; Abdomen: Soft, Nontender, Nondistended, Normal bowel sounds; Genitourinary: No CVA tenderness; Spine:  No midline CS, TS, LS tenderness.;; Extremities: Pulses normal, Pelvis  stable. +right hip tenderness to palp. No deformity, no edema, no ecchymosis, no erythema.  No calf edema or asymmetry.; Neuro: AA&Ox3, Major CN grossly intact.  Speech clear. No gross focal motor or sensory deficits in extremities.; Skin: Color normal, Warm, Dry.   ED Treatments / Results  Labs (all labs ordered are listed, but only abnormal results are displayed)   EKG  EKG Interpretation None       Radiology   Procedures Procedures (including critical care time)  Medications Ordered in ED Medications  acetaminophen (TYLENOL) tablet 650 mg (650 mg Oral Given 03/29/17 1144)  ibuprofen (ADVIL,MOTRIN) tablet 400 mg (400 mg Oral Given 03/29/17 1144)  cyclobenzaprine (FLEXERIL) tablet 10 mg (10 mg Oral Given 03/29/17 1144)     Initial Impression / Assessment and Plan / ED Course  I have reviewed the triage vital signs and the nursing notes.  Pertinent labs & imaging results that were available during my care of the patient were reviewed by me and considered in  my medical decision making (see chart for details).  MDM Reviewed: previous chart, nursing note and vitals Reviewed previous: labs Interpretation: labs, x-ray and CT scan   Results for orders placed or performed during the hospital encounter of 03/29/17  CBC with Differential  Result Value Ref Range   WBC 7.9 4.0 - 10.5 K/uL   RBC 4.34 3.87 - 5.11 MIL/uL   Hemoglobin 14.0 12.0 - 15.0 g/dL   HCT 19.1 47.8 - 29.5 %   MCV 92.4 78.0 - 100.0 fL   MCH 32.3 26.0 - 34.0 pg   MCHC 34.9 30.0 - 36.0 g/dL   RDW 62.1 30.8 - 65.7 %   Platelets 291 150 - 400 K/uL   Neutrophils Relative % 60 %   Neutro Abs 4.8 1.7 - 7.7 K/uL   Lymphocytes Relative 31 %   Lymphs Abs 2.5 0.7 - 4.0 K/uL   Monocytes Relative 6 %   Monocytes Absolute 0.4 0.1 - 1.0 K/uL   Eosinophils Relative 3 %   Eosinophils Absolute 0.2 0.0 - 0.7 K/uL   Basophils Relative 0 %   Basophils Absolute 0.0 0.0 - 0.1 K/uL  Basic metabolic panel  Result Value Ref Range   Sodium 136 135 - 145 mmol/L   Potassium 3.6 3.5 - 5.1 mmol/L   Chloride 103 101 - 111 mmol/L   CO2 24 22 - 32 mmol/L   Glucose, Bld 104 (H) 65 - 99 mg/dL   BUN 11 6 - 20 mg/dL   Creatinine, Ser 8.46 0.44 - 1.00 mg/dL   Calcium 9.2 8.9 - 96.2 mg/dL   GFR calc non Af Amer >60 >60 mL/min   GFR calc Af Amer >60 >60 mL/min   Anion gap 9 5 - 15  I-Stat beta hCG blood, ED  Result Value Ref Range   I-stat hCG, quantitative <5.0 <5 mIU/mL   Comment 3           Dg Lumbar Spine Complete Result Date: 03/29/2017 CLINICAL DATA:  Chronic pain.  No trauma history submitted. EXAM: LUMBAR SPINE - COMPLETE 4+ VIEW COMPARISON:  CT of 09/12/2016 FINDINGS: Incomplete fusion of the posterior elements at L5. Five lumbar type vertebral bodies. Sacroiliac joints are symmetric. Right hip arthroplasty. Minimal convex left lumbar spine curvature. Maintenance of vertebral body height and alignment. Intervertebral disc heights are maintained. Aortic atherosclerosis. Facet arthropathy  involves L5-S1. IMPRESSION: No acute osseous abnormality. Age advanced aortic atherosclerosis. Electronically Signed   By: Hosie Spangle.D.  On: 03/29/2017 12:20   Ct Hip Right W Contrast Result Date: 03/29/2017 CLINICAL DATA:  Chronic right hip pain. Followup abnormal radiographs. EXAM: CT OF THE LOWER RIGHT EXTREMITY WITH CONTRAST TECHNIQUE: Multidetector CT imaging of the lower right extremity was performed according to the standard protocol following intravenous contrast administration. COMPARISON:  Radiographs 03/29/2017 and CT scan 09/12/2016 and 03/21/2016 CONTRAST:  ISOVUE-300 IOPAMIDOL (ISOVUE-300) INJECTION 61% FINDINGS: The femoral component demonstrates moderate lucency around the prosthesis consistent with loosening. I do not see aggressive lytic change to suggest particle disease. This is more pronounced proximally. No stress fracture. The medial and inferior aspect of the acetabular component is also loose with moderate lucency noted. Stable/chronic cystic changes noted in the superior acetabulum. The pubic bones are intact. Moderate degenerative change at the pubic symphysis. The visualized right SI joint is grossly normal. No significant intrapelvic abnormalities. No inguinal mass or adenopathy. IMPRESSION: Loose femoral and acetabular components of a total right hip arthroplasty. Findings are progressive since prior CT scan of 1 year ago. Electronically Signed   By: Rudie Meyer M.D.   On: 03/29/2017 14:49   Dg Hip Unilat With Pelvis 2-3 Views Right Result Date: 03/29/2017 CLINICAL DATA:  Chronic right hip pain.  Low back pain. EXAM: DG HIP (WITH OR WITHOUT PELVIS) 2-3V RIGHT COMPARISON:  None. FINDINGS: Right total hip arthroplasty. Lucency around the proximal femoral stem component in the intertrochanteric region concerning for loosening versus infection. Well-circumscribed lucency in the right superior acetabulum adjacent to the acetabular cup component likely reflecting  subchondral cysts which were present prior to placement of the arthroplasty. No acute fracture or dislocation. IMPRESSION: Right total hip arthroplasty. Lucency around the proximal femoral stem component in the intertrochanteric region concerning for loosening versus infection. Electronically Signed   By: Elige Ko   On: 03/29/2017 12:20    1520:  XR with f/u CT scan as above. WBC count normal and pt remains afebrile. No s/s of infection on CT or on skin.  T/C to Ortho Dr. Lajoyce Corners (on call for Dr. Magnus Ivan), case discussed, including:  HPI, pertinent PM/SHx, VS/PE, dx testing, ED course and treatment:  Agrees with ED treatment, symptomatic tx, f/u office. Dx and testing, as well as d/w Ortho MD, d/w pt.  Questions answered.  Verb understanding, agreeable to d/c home with outpt f/u.   Final Clinical Impressions(s) / ED Diagnoses   Final diagnoses:  Pain  Abnormal x-ray    New Prescriptions New Prescriptions   No medications on file     Samuel Jester, DO 04/04/17 [id number]

## 2017-03-29 NOTE — ED Triage Notes (Signed)
Pt c/o chronic r hip pain and chronic cramps x 2 days now. Nad. States cramps last 30 min or less

## 2017-03-29 NOTE — Discharge Instructions (Signed)
Take the prescriptions as directed.  Apply moist heat or ice to the area(s) of discomfort, for 15 minutes at a time, several times per day for the next few days.  Do not fall asleep on a heating or ice pack.  Call your regular Orthopedic doctor today to schedule a follow up appointment this week.  Return to the Emergency Department immediately if worsening. ° °

## 2017-06-28 ENCOUNTER — Encounter (INDEPENDENT_AMBULATORY_CARE_PROVIDER_SITE_OTHER): Payer: Self-pay | Admitting: Orthopaedic Surgery

## 2017-06-28 ENCOUNTER — Ambulatory Visit (INDEPENDENT_AMBULATORY_CARE_PROVIDER_SITE_OTHER): Payer: BLUE CROSS/BLUE SHIELD | Admitting: Orthopaedic Surgery

## 2017-06-28 DIAGNOSIS — Z96641 Presence of right artificial hip joint: Secondary | ICD-10-CM

## 2017-06-28 DIAGNOSIS — Z471 Aftercare following joint replacement surgery: Secondary | ICD-10-CM | POA: Diagnosis not present

## 2017-06-28 MED ORDER — HYDROCODONE-ACETAMINOPHEN 5-325 MG PO TABS: 1.0000 | ORAL_TABLET | Freq: Three times a day (TID) | ORAL | 0 refills | Status: DC | PRN

## 2017-06-28 NOTE — Progress Notes (Signed)
A she is well-known to me. In 2016 she had a right total hip arthroplasty at a young age to treat end-stage avascular necrosis. She's been having hip pain for the last several months. States it has plain films and a CT scan for me to review. It hurts some in the groin pain or thigh. It is affecting her back and she actually went to the emergency room last night just to back pain. She is on her feet 12 hours a day with work.  On exam I can put her hip the range of motion but is deathly painful to her. I did review the CT scan and even operative note. She has a small femoral component which is a size 8. That is the smallest 1. It does appear that there is some lucency around the stem. Systemically she does not appear ill and her incision looks good. She does not appear to have an infectious process going on and she denies any fever and chills.  At this point I would like to obtain a 3 phase bone scan for further information but I do feel that we'll likely need to revise her hip components. I talked her about this in length. I'll see her back in just 2 weeks no fluid the bone scan will be done and we can proceed with the recommendations from there.

## 2017-06-30 ENCOUNTER — Telehealth (INDEPENDENT_AMBULATORY_CARE_PROVIDER_SITE_OTHER): Payer: Self-pay | Admitting: *Deleted

## 2017-06-30 NOTE — Telephone Encounter (Signed)
I C pt no answer, vm full, pt has appt for NM Bone Scan 3 phase on Friday Aug 24th at 11am for injection and come back at 2pm for the scan at [facility] radiology. Pt is to rturn my call. Pending call back.

## 2017-07-07 ENCOUNTER — Encounter (HOSPITAL_COMMUNITY)
Admission: RE | Admit: 2017-07-07 | Discharge: 2017-07-07 | Disposition: A | Discharge status: Home / Self Care | Payer: BLUE CROSS/BLUE SHIELD | Source: Ambulatory Visit | Attending: Orthopaedic Surgery | Admitting: Orthopaedic Surgery

## 2017-07-07 ENCOUNTER — Ambulatory Visit (HOSPITAL_COMMUNITY)
Admission: RE | Admit: 2017-07-07 | Discharge: 2017-07-07 | Disposition: A | Discharge status: Home / Self Care | Payer: BLUE CROSS/BLUE SHIELD | Source: Ambulatory Visit | Attending: Orthopaedic Surgery | Admitting: Orthopaedic Surgery

## 2017-07-07 DIAGNOSIS — Z471 Aftercare following joint replacement surgery: Secondary | ICD-10-CM

## 2017-07-07 DIAGNOSIS — Z96641 Presence of right artificial hip joint: Secondary | ICD-10-CM | POA: Diagnosis not present

## 2017-07-07 MED ORDER — TECHNETIUM TC 99M MEDRONATE IV KIT
25.0000 | PACK | Freq: Once | INTRAVENOUS | Status: AC | PRN
  Administered 2017-07-07: 25 via INTRAVENOUS

## 2017-07-10 ENCOUNTER — Ambulatory Visit (INDEPENDENT_AMBULATORY_CARE_PROVIDER_SITE_OTHER): Payer: Self-pay | Admitting: Orthopaedic Surgery

## 2017-07-11 ENCOUNTER — Ambulatory Visit (INDEPENDENT_AMBULATORY_CARE_PROVIDER_SITE_OTHER): Payer: BLUE CROSS/BLUE SHIELD | Admitting: Orthopaedic Surgery

## 2017-07-11 DIAGNOSIS — M7061 Trochanteric bursitis, right hip: Secondary | ICD-10-CM

## 2017-07-11 DIAGNOSIS — Z96641 Presence of right artificial hip joint: Secondary | ICD-10-CM

## 2017-07-11 MED ORDER — LIDOCAINE HCL 1 % IJ SOLN
3.0000 mL | INTRAMUSCULAR | Status: AC | PRN
  Administered 2017-07-11: 3 mL

## 2017-07-11 MED ORDER — METHYLPREDNISOLONE ACETATE 40 MG/ML IJ SUSP
40.0000 mg | INTRAMUSCULAR | Status: AC | PRN
  Administered 2017-07-11: 40 mg via INTRA_ARTICULAR

## 2017-07-11 NOTE — Progress Notes (Signed)
Office Visit Note   Patient: Mariah Bell           Date of Birth: Mar 14, 1976           MRN: [id number] Visit Date: 07/11/2017              Requested by: Practice, [facility] [address] [place], [place] [address] PCP: Patient, No Pcp Per   Assessment & Plan: Visit Diagnoses:  1. Status post total replacement of right hip   2. Trochanteric bursitis, right hip     Plan:I'm still concerned that she has prosthetic loosening based off her exam as well as her plain films and CT scan. We need at this point tried trochanteric injection and I showed her stretching exercises that are demonstrated is back to me. I'll see her back in just a week. If she is not making significant progress with may need to consider operative intervention. I explained this to her in detail. We'll talk about it again in a week based on what her clinical exam findings are.  Follow-Up Instructions: Return in about 1 week (around 07/18/2017).   Orders:  Orders Placed This Encounter  Procedures  . Large Joint Injection/Arthrocentesis   No orders of the defined types were placed in this encounter.     Procedures: Large Joint Inj Date/Time: 07/11/2017 9:14 AM Performed by: Kathryne Hitch Authorized by: Kathryne Hitch   Location:  Hip Site:  R greater trochanter Ultrasound Guidance: No   Fluoroscopic Guidance: No   Arthrogram: No   Medications:  3 mL lidocaine 1 %; 40 mg methylPREDNISolone acetate 40 MG/ML     Clinical Data: No additional findings.   Subjective: No chief complaint on file. The patient still having severe debilitating right hip pain. She is 21 months out from a right total hip arthroplasty. A CT scan and plain films in that hip earlier this year suggest prosthetic loosening. I sent her for a 3 phase bone scan. She still having pain over trochanteric area and she feels like it swollen as well. Her pain is daily and is detrimentally affecting her activity is daily  living, her mobility, and her quality of life. She is developing back pain now from how she is, sitting for her hip pain.  HPI  Review of Systems He still denies any fever, chills, nausea, vomiting.  Objective: Vital Signs: There were no vitals taken for this visit.  Physical Exam He is alert or 3 and in no acute distress Ortho Exam Examination of her right hip shows pain severely of the trochanteric area with fluid range of motion actively and passively no pain in the groin. Her incisions well-healed there is no redness or cellulitis. His no induration. Specialty Comments:  No specialty comments available.  Imaging: No results found. The 3 phase bone scan is reviewed of her right hip and shows no evidence of prosthetic loosening.  PMFS History: Patient Active Problem List   Diagnosis Date Noted  . Trochanteric bursitis, right hip 07/11/2017  . Avascular necrosis of bone of right hip (HCC) 10/09/2015  . Status post total replacement of right hip 10/09/2015   Past Medical History:  Diagnosis Date  . Arthritis   . Asthma   . Chronic back pain   . Chronic hip pain   . Hypertension     Family History  Problem Relation Age of Onset  . Diabetes Mother     Past Surgical History:  Procedure Laterality Date  .  TONSILLECTOMY    . TOTAL HIP ARTHROPLASTY Right 10/09/2015   Procedure: RIGHT TOTAL HIP ARTHROPLASTY ANTERIOR APPROACH;  Surgeon: Kathryne Hitch, MD;  Location: [facility];  Service: Orthopedics;  Laterality: Right;  . TUBAL LIGATION     Social History   Occupational History  . Not on file.   Social History Main Topics  . Smoking status: Current Every Day Smoker    Packs/day: 0.50    Types: Cigarettes  . Smokeless tobacco: Never Used  . Alcohol use No  . Drug use: No  . Sexual activity: Not Currently    Birth control/ protection: Surgical

## 2017-07-18 ENCOUNTER — Ambulatory Visit (INDEPENDENT_AMBULATORY_CARE_PROVIDER_SITE_OTHER): Payer: BLUE CROSS/BLUE SHIELD | Admitting: Orthopaedic Surgery

## 2017-07-18 DIAGNOSIS — Z96641 Presence of right artificial hip joint: Secondary | ICD-10-CM

## 2017-07-18 DIAGNOSIS — T84030A Mechanical loosening of internal right hip prosthetic joint, initial encounter: Secondary | ICD-10-CM | POA: Insufficient documentation

## 2017-07-18 NOTE — Progress Notes (Signed)
The patient is well-known to me. She had a right total hip arthroplasty in November 2016. She complains of right hip pain ever since that surgery. She said the swelling never went down as well. She had a CT scan and plain films of her right hip in May of this year that was concerning for prosthetic loosening. She's had no evidence infection. A 3 phase bone scan did not show loosening but I feel that the CT scan and the plain films have more evidence of this may be loose components. I did try trochanteric injection she said did not help at all in terms of her pain. Is a daily pain is more mechanical in nature. She denies any fever, chills, nausea, vomiting.  On examination right hip I can easily move her hip the range of motion but she said it is of the pain. Incisions well-healed.  At this point given the combination of findings with pain and swelling as well as plain film and CT scan suggesting prosthetic loosening I feel this point a revision is needed. I showed her hip model and explained in detail what this involves as well as a thorough discussion of the risks and benefits of surgery and our reasoning behind this recommendation. All questions were encouraged and answered. She like to have this done in October. We would then see her back in 2 weeks postoperative.

## 2017-08-11 ENCOUNTER — Telehealth (INDEPENDENT_AMBULATORY_CARE_PROVIDER_SITE_OTHER): Payer: Self-pay | Admitting: Orthopaedic Surgery

## 2017-08-11 MED ORDER — HYDROCODONE-ACETAMINOPHEN 5-325 MG PO TABS: 1.0000 | ORAL_TABLET | Freq: Three times a day (TID) | ORAL | 0 refills | Status: DC | PRN

## 2017-08-11 NOTE — Telephone Encounter (Signed)
She can come by and pick up one script for hydrocodone, but let her know I can't keep her on this due to narcotics laws. She can take advil or aleve for swelling.

## 2017-08-11 NOTE — Telephone Encounter (Signed)
Please advise  Patient isn't going to have surgery right now, she doesn't have insurance. She will re-schedule this when she starts getting insurance again

## 2017-08-11 NOTE — Telephone Encounter (Signed)
Patient called needing Rx refilled for Hydrocodone and something for the swelling in her right leg. The number to contact patient is [phone]

## 2017-08-14 NOTE — Telephone Encounter (Signed)
I tried to call patient back on this only number we have, phone number no longer works

## 2018-01-18 ENCOUNTER — Ambulatory Visit (INDEPENDENT_AMBULATORY_CARE_PROVIDER_SITE_OTHER): Payer: BLUE CROSS/BLUE SHIELD | Admitting: Physician Assistant

## 2018-02-01 ENCOUNTER — Emergency Department (HOSPITAL_COMMUNITY)
Admission: EM | Admit: 2018-02-01 | Discharge: 2018-02-01 | Disposition: A | Discharge status: Home / Self Care | Payer: BLUE CROSS/BLUE SHIELD | Attending: Emergency Medicine | Admitting: Emergency Medicine

## 2018-02-01 ENCOUNTER — Encounter (HOSPITAL_COMMUNITY): Payer: Self-pay | Admitting: Emergency Medicine

## 2018-02-01 ENCOUNTER — Other Ambulatory Visit: Payer: Self-pay

## 2018-02-01 DIAGNOSIS — M436 Torticollis: Secondary | ICD-10-CM | POA: Insufficient documentation

## 2018-02-01 DIAGNOSIS — J45909 Unspecified asthma, uncomplicated: Secondary | ICD-10-CM | POA: Insufficient documentation

## 2018-02-01 DIAGNOSIS — F17210 Nicotine dependence, cigarettes, uncomplicated: Secondary | ICD-10-CM | POA: Insufficient documentation

## 2018-02-01 DIAGNOSIS — Z79899 Other long term (current) drug therapy: Secondary | ICD-10-CM | POA: Insufficient documentation

## 2018-02-01 DIAGNOSIS — Z7982 Long term (current) use of aspirin: Secondary | ICD-10-CM | POA: Insufficient documentation

## 2018-02-01 DIAGNOSIS — I10 Essential (primary) hypertension: Secondary | ICD-10-CM | POA: Insufficient documentation

## 2018-02-01 MED ORDER — KETOROLAC TROMETHAMINE 30 MG/ML IJ SOLN
30.0000 mg | Freq: Once | INTRAMUSCULAR | Status: AC
  Administered 2018-02-01: 30 mg via INTRAMUSCULAR
  Filled 2018-02-01: qty 1

## 2018-02-01 MED ORDER — NAPROXEN 250 MG PO TABS: ORAL_TABLET | ORAL | 0 refills | Status: DC

## 2018-02-01 MED ORDER — CYCLOBENZAPRINE HCL 10 MG PO TABS
10.0000 mg | ORAL_TABLET | Freq: Once | ORAL | Status: AC
  Administered 2018-02-01: 10 mg via ORAL
  Filled 2018-02-01: qty 1

## 2018-02-01 MED ORDER — CYCLOBENZAPRINE HCL 5 MG PO TABS: 5.0000 mg | ORAL_TABLET | Freq: Three times a day (TID) | ORAL | 0 refills | Status: DC | PRN

## 2018-02-01 NOTE — ED Provider Notes (Signed)
[facility] EMERGENCY DEPARTMENT Provider Note   CSN: [id number] Arrival date & time: 02/01/18  0359  Time seen 05:06 AM   History   Chief Complaint Chief Complaint  Patient presents with  . Neck Pain    HPI Mariah Bell is a 42 y.o. female.  HPI she states she woke up on March 19 with pain in the right side of her neck.  She denies any change of her activity.  She denies any numbness or tingling of extremities.  She is right-handed.  She states sitting up and moving her head makes the pain worse, heat makes it feel a little bit better.  She is never had this happen before.  She denies any injury.  PCP [facility] in [place], first appt next week   Past Medical History:  Diagnosis Date  . Arthritis   . Asthma   . Chronic back pain   . Chronic hip pain   . Hypertension     Patient Active Problem List   Diagnosis Date Noted  . 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 Trochanteric bursitis, right hip 07/11/2017  . Avascular necrosis of bone of right hip (HCC) 10/09/2015  . 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 TONSILLECTOMY    . TOTAL HIP ARTHROPLASTY Right 10/09/2015   Procedure: RIGHT TOTAL HIP ARTHROPLASTY ANTERIOR APPROACH;  Surgeon: Kathryne Hitch, MD;  Location: [facility];  Service: Orthopedics;  Laterality: Right;  . TUBAL LIGATION      OB History    Gravida  3   Para  2   Term  1   Preterm  1   AB  1   Living        SAB      TAB  1   Ectopic      Multiple      Live Births               Home Medications    Prior to Admission medications   Medication Sig Start Date End Date Taking? Authorizing Provider  albuterol (PROVENTIL HFA;VENTOLIN HFA) 108 (90 BASE) MCG/ACT inhaler Inhale 2 puffs into the lungs every 6 (six) hours as needed for wheezing or shortness of breath.   Yes [provider]    lisinopril-hydrochlorothiazide (PRINZIDE,ZESTORETIC) 20-12.5 MG per tablet Take 1 tablet by mouth daily.  01/28/14  Yes [provider]  amLODipine (NORVASC) 5 MG tablet Take 5 mg by mouth daily.    [provider]  aspirin EC 325 MG EC tablet Take 1 tablet (325 mg total) by mouth 2 (two) times daily after a meal. 10/11/15   Chestine Spore, Allayne Gitelman, PA-C  clindamycin (CLEOCIN) 300 MG capsule Take 1 capsule (300 mg total) by mouth 3 (three) times daily. Take with food Patient not taking: Reported on 03/29/2017 04/23/16   Ivery Quale, PA-C  cyclobenzaprine (FLEXERIL) 5 MG tablet Take 1 tablet (5 mg total) by mouth 3 (three) times daily as needed (muscle soreness). 02/01/18   Devoria Albe, MD  diphenhydrAMINE (BENADRYL) 25 MG tablet Take 1 tablet (25 mg total) by mouth every 6 (six) hours. 01/24/16   Kirichenko, Tatyana, PA-C  famotidine (PEPCID) 20 MG tablet Take 1 tablet (20 mg total) by mouth 2 (two) times daily. 01/24/16   Kirichenko, Lemont Fillers, PA-C  HYDROcodone-acetaminophen (NORCO/VICODIN) 5-325 MG tablet Take 1 tablet by mouth 3 (three) times daily  as needed for moderate pain. 08/11/17   Kathryne HitchBlackman, Christopher Y, MD  ibuprofen (ADVIL,MOTRIN) 800 MG tablet Take 1 tablet (800 mg total) by mouth every 8 (eight) hours as needed for mild pain. 09/11/16   Ward, Layla MawKristen N, DO  methocarbamol (ROBAXIN) 500 MG tablet Take 2 tablets (1,000 mg total) by mouth 4 (four) times daily as needed for muscle spasms (muscle spasm/pain). 03/29/17   Samuel JesterMcManus, Kathleen, DO  methocarbamol (ROBAXIN) 500 MG tablet Take 2 tablets (1,000 mg total) by mouth 4 (four) times daily. 03/29/17   Mancel BaleWentz, Elliott, MD  naproxen (NAPROSYN) 250 MG tablet Take 1 po BID with food prn pain 02/01/18   Devoria AlbeKnapp, Corrie Brannen, MD  oxyCODONE-acetaminophen (ROXICET) 5-325 MG tablet Take 1-2 tablets by mouth every 4 (four) hours as needed for severe pain. Patient not taking: Reported on 03/29/2017 10/11/15   Kirtland Bouchardlark, Gilbert W, PA-C  predniSONE (DELTASONE) 10  MG tablet Take 2 tablets (20 mg total) by mouth daily. Patient not taking: Reported on 03/29/2017 01/24/16   Jaynie CrumbleKirichenko, Tatyana, PA-C  predniSONE (DELTASONE) 10 MG tablet Take 5 tab day 1, take 4 tab day 2, take 3 tab day 3, take 2 tab day 4, and take 1 tab day 5 Patient not taking: Reported on 03/29/2017 01/24/16   Jaynie CrumbleKirichenko, Tatyana, PA-C  traMADol (ULTRAM) 50 MG tablet Take 1 tablet (50 mg total) by mouth every 6 (six) hours as needed for moderate pain or severe pain. 03/29/17   Samuel JesterMcManus, Kathleen, DO  traMADol (ULTRAM) 50 MG tablet Take 1 tablet (50 mg total) by mouth every 6 (six) hours as needed. 03/29/17   Mancel BaleWentz, Elliott, MD  zolpidem (AMBIEN) 10 MG tablet Take 10 mg by mouth at bedtime.  06/09/14   [provider]    Family History Family History  Problem Relation Age of Onset  . Diabetes Mother     Social History Social History   Tobacco Use  . Smoking status: Current Every Day Smoker    Packs/day: 0.50    Types: Cigarettes  . Smokeless tobacco: Never Used  Substance Use Topics  . Alcohol use: No  . Drug use: No  employed    Allergies   Patient has no known allergies.   Review of Systems Review of Systems  All other systems reviewed and are negative.    Physical Exam Updated Vital Signs BP (!) 121/91   Pulse 71   Temp 98.1 F (36.7 C)   Resp 17   Ht 5\' 9"  (1.753 m)   Wt 89.8 kg (198 lb)   LMP 01/07/2018   SpO2 100%   BMI 29.24 kg/m   Vital signs normal    Physical Exam  Constitutional: She is oriented to person, place, and time. She appears well-developed and well-nourished. She appears distressed.  HENT:  Head: Normocephalic and atraumatic.  Right Ear: External ear normal.  Left Ear: External ear normal.  Nose: Nose normal.  Eyes: Conjunctivae and EOM are normal.  Neck:  Patient is very tender to palpation over her right trapezius muscle, she is nontender to palpation over the cervical spine or in the shoulder.  When she turns her head it  causes pain.  Cardiovascular: Normal rate.  Pulmonary/Chest: Effort normal. No respiratory distress.  Musculoskeletal: She exhibits tenderness. She exhibits no edema or deformity.  She has good distal pulses  Neurological: She is alert and oriented to person, place, and time. No cranial nerve deficit.  Grips are equal  Skin: Skin is warm and dry. No  rash noted. No erythema.  Psychiatric: She has a normal mood and affect. Her behavior is normal. Thought content normal.  Nursing note and vitals reviewed.    ED Treatments / Results  Labs (all labs ordered are listed, but only abnormal results are displayed) Labs Reviewed - No data to display  EKG  EKG Interpretation None       Radiology No results found.  Procedures Procedures (including critical care time)  Medications Ordered in ED Medications  ketorolac (TORADOL) 30 MG/ML injection 30 mg (30 mg Intramuscular Given 02/01/18 0526)  cyclobenzaprine (FLEXERIL) tablet 10 mg (10 mg Oral Given 02/01/18 0526)     Initial Impression / Assessment and Plan / ED Course  I have reviewed the triage vital signs and the nursing notes.  Pertinent labs & imaging results that were available during my care of the patient were reviewed by me and considered in my medical decision making (see chart for details).     Patient has acute tenderness on waking from sleep 2 days ago in her right trapezius muscle.  She was advised to use ice and heat for comfort, she was discharged home with anti-inflammatory and muscle relaxer.  She should be rechecked if she is not improving in the next week.  She has her first appointment next week at a clinic in [place] who are going to be managing her blood pressure.   Final Clinical Impressions(s) / ED Diagnoses   Final diagnoses:  Torticollis    ED Discharge Orders        Ordered    naproxen (NAPROSYN) 250 MG tablet     02/01/18 0539    cyclobenzaprine (FLEXERIL) 5 MG tablet  3 times daily PRN      02/01/18 0539      Plan discharge  Devoria Albe, MD, Concha Pyo, MD 02/01/18 [phone]

## 2018-02-01 NOTE — Discharge Instructions (Addendum)
Use ice and heat for comfort. Take the medication as prescribed. Recheck if you aren't improving over the next week.

## 2018-02-01 NOTE — ED Triage Notes (Signed)
Pt c/o right sided neck pain x 2 days. Pt states she woke up with the pain and has been using heat on area and taking aleve with no relief.

## 2018-02-08 ENCOUNTER — Other Ambulatory Visit: Payer: Self-pay

## 2018-02-08 ENCOUNTER — Emergency Department (HOSPITAL_COMMUNITY): Payer: Self-pay

## 2018-02-08 ENCOUNTER — Emergency Department (HOSPITAL_COMMUNITY)
Admission: EM | Admit: 2018-02-08 | Discharge: 2018-02-08 | Disposition: A | Discharge status: Home / Self Care | Payer: Self-pay | Attending: Emergency Medicine | Admitting: Emergency Medicine

## 2018-02-08 ENCOUNTER — Encounter (HOSPITAL_COMMUNITY): Payer: Self-pay | Admitting: Emergency Medicine

## 2018-02-08 DIAGNOSIS — W01198A Fall on same level from slipping, tripping and stumbling with subsequent striking against other object, initial encounter: Secondary | ICD-10-CM | POA: Insufficient documentation

## 2018-02-08 DIAGNOSIS — Y998 Other external cause status: Secondary | ICD-10-CM | POA: Insufficient documentation

## 2018-02-08 DIAGNOSIS — J45909 Unspecified asthma, uncomplicated: Secondary | ICD-10-CM | POA: Insufficient documentation

## 2018-02-08 DIAGNOSIS — Z7982 Long term (current) use of aspirin: Secondary | ICD-10-CM | POA: Insufficient documentation

## 2018-02-08 DIAGNOSIS — S300XXA Contusion of lower back and pelvis, initial encounter: Secondary | ICD-10-CM | POA: Insufficient documentation

## 2018-02-08 DIAGNOSIS — Y929 Unspecified place or not applicable: Secondary | ICD-10-CM | POA: Insufficient documentation

## 2018-02-08 DIAGNOSIS — Z79899 Other long term (current) drug therapy: Secondary | ICD-10-CM | POA: Insufficient documentation

## 2018-02-08 DIAGNOSIS — Z96641 Presence of right artificial hip joint: Secondary | ICD-10-CM | POA: Insufficient documentation

## 2018-02-08 DIAGNOSIS — Y9344 Activity, trampolining: Secondary | ICD-10-CM | POA: Insufficient documentation

## 2018-02-08 DIAGNOSIS — F17210 Nicotine dependence, cigarettes, uncomplicated: Secondary | ICD-10-CM | POA: Insufficient documentation

## 2018-02-08 LAB — URINALYSIS, ROUTINE W REFLEX MICROSCOPIC
Bilirubin Urine: NEGATIVE
Glucose, UA: NEGATIVE mg/dL
Ketones, ur: NEGATIVE mg/dL
Nitrite: NEGATIVE
PH: 5 (ref 5.0–8.0)
Protein, ur: NEGATIVE mg/dL
SPECIFIC GRAVITY, URINE: 1.021 (ref 1.005–1.030)

## 2018-02-08 LAB — POC URINE PREG, ED: PREG TEST UR: NEGATIVE

## 2018-02-08 NOTE — ED Triage Notes (Signed)
Pt fell on trampoline with back striking metal pole. Pt c/o lower back pain and bilateral flank pain. Pt states there is a pressure pain after she urinates.

## 2018-02-08 NOTE — Discharge Instructions (Addendum)
Use ice and heat for comfort. Take the medications you were prescribed last week for the pain. Recheck if not improving over the next week or if you get blood in your urine.

## 2018-02-08 NOTE — ED Provider Notes (Signed)
[facility] EMERGENCY DEPARTMENT Provider Note   CSN: [id number] Arrival date & time: 02/08/18  0017     History   Chief Complaint Chief Complaint  Patient presents with  . Fall    HPI Mariah Bell is a 42 y.o. female.  The history is provided by the patient. No language interpreter was used.  Fall  This is a new problem. The current episode started 6 to 12 hours ago. The problem occurs constantly. The problem has not changed since onset.Nothing aggravates the symptoms. Nothing relieves the symptoms. She has tried nothing for the symptoms. The treatment provided no relief.  Pt complains of pain in her back.  Pt reports she fell on a trampoline today and hit her back.  Pt reports she also might have a uti.  Pt has had multiple in the past.     Past Medical History:  Diagnosis Date  . Arthritis   . Asthma   . Chronic back pain   . Chronic hip pain   . Hypertension     Patient Active Problem List   Diagnosis Date Noted  . 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 Trochanteric bursitis, right hip 07/11/2017  . Avascular necrosis of bone of right hip (HCC) 10/09/2015  . 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 TONSILLECTOMY    . TOTAL HIP ARTHROPLASTY Right 10/09/2015   Procedure: RIGHT TOTAL HIP ARTHROPLASTY ANTERIOR APPROACH;  Surgeon: Kathryne Hitch, MD;  Location: [facility];  Service: Orthopedics;  Laterality: Right;  . TUBAL LIGATION       OB History    Gravida  3   Para  2   Term  1   Preterm  1   AB  1   Living        SAB      TAB  1   Ectopic      Multiple      Live Births               Home Medications    Prior to Admission medications   Medication Sig Start Date End Date Taking? Authorizing Provider  albuterol (PROVENTIL HFA;VENTOLIN HFA) 108 (90 BASE) MCG/ACT inhaler Inhale 2 puffs into the lungs every 6 (six)  hours as needed for wheezing or shortness of breath.    [provider]  amLODipine (NORVASC) 5 MG tablet Take 5 mg by mouth daily.    [provider]  aspirin EC 325 MG EC tablet Take 1 tablet (325 mg total) by mouth 2 (two) times daily after a meal. 10/11/15   Chestine Spore, Allayne Gitelman, PA-C  clindamycin (CLEOCIN) 300 MG capsule Take 1 capsule (300 mg total) by mouth 3 (three) times daily. Take with food Patient not taking: Reported on 03/29/2017 04/23/16   Ivery Quale, PA-C  cyclobenzaprine (FLEXERIL) 5 MG tablet Take 1 tablet (5 mg total) by mouth 3 (three) times daily as needed (muscle soreness). 02/01/18   Devoria Albe, MD  diphenhydrAMINE (BENADRYL) 25 MG tablet Take 1 tablet (25 mg total) by mouth every 6 (six) hours. 01/24/16   Kirichenko, Tatyana, PA-C  famotidine (PEPCID) 20 MG tablet Take 1 tablet (20 mg total) by mouth 2 (two) times daily. 01/24/16   Kirichenko, Lemont Fillers, PA-C  HYDROcodone-acetaminophen (NORCO/VICODIN) 5-325 MG tablet Take 1 tablet by mouth 3 (three) times daily as needed for moderate pain. 08/11/17   Magnus Ivan,  Vanita Pandahristopher Y, MD  ibuprofen (ADVIL,MOTRIN) 800 MG tablet Take 1 tablet (800 mg total) by mouth every 8 (eight) hours as needed for mild pain. 09/11/16   Ward, Layla MawKristen N, DO  lisinopril-hydrochlorothiazide (PRINZIDE,ZESTORETIC) 20-12.5 MG per tablet Take 1 tablet by mouth daily.  01/28/14   [provider]  methocarbamol (ROBAXIN) 500 MG tablet Take 2 tablets (1,000 mg total) by mouth 4 (four) times daily as needed for muscle spasms (muscle spasm/pain). 03/29/17   Samuel JesterMcManus, Kathleen, DO  methocarbamol (ROBAXIN) 500 MG tablet Take 2 tablets (1,000 mg total) by mouth 4 (four) times daily. 03/29/17   Mancel BaleWentz, Elliott, MD  naproxen (NAPROSYN) 250 MG tablet Take 1 po BID with food prn pain 02/01/18   Devoria AlbeKnapp, Iva, MD  oxyCODONE-acetaminophen (ROXICET) 5-325 MG tablet Take 1-2 tablets by mouth every 4 (four) hours as needed for severe pain. Patient not taking:  Reported on 03/29/2017 10/11/15   Kirtland Bouchardlark, Gilbert W, PA-C  predniSONE (DELTASONE) 10 MG tablet Take 2 tablets (20 mg total) by mouth daily. Patient not taking: Reported on 03/29/2017 01/24/16   Jaynie CrumbleKirichenko, Tatyana, PA-C  predniSONE (DELTASONE) 10 MG tablet Take 5 tab day 1, take 4 tab day 2, take 3 tab day 3, take 2 tab day 4, and take 1 tab day 5 Patient not taking: Reported on 03/29/2017 01/24/16   Jaynie CrumbleKirichenko, Tatyana, PA-C  traMADol (ULTRAM) 50 MG tablet Take 1 tablet (50 mg total) by mouth every 6 (six) hours as needed for moderate pain or severe pain. 03/29/17   Samuel JesterMcManus, Kathleen, DO  traMADol (ULTRAM) 50 MG tablet Take 1 tablet (50 mg total) by mouth every 6 (six) hours as needed. 03/29/17   Mancel BaleWentz, Elliott, MD  zolpidem (AMBIEN) 10 MG tablet Take 10 mg by mouth at bedtime.  06/09/14   [provider]    Family History Family History  Problem Relation Age of Onset  . Diabetes Mother     Social History Social History   Tobacco Use  . Smoking status: Current Every Day Smoker    Packs/day: 0.50    Types: Cigarettes  . Smokeless tobacco: Never Used  Substance Use Topics  . Alcohol use: No  . Drug use: No     Allergies   Patient has no known allergies.   Review of Systems Review of Systems  Genitourinary: Positive for dysuria.  All other systems reviewed and are negative.    Physical Exam Updated Vital Signs BP (!) 127/91   Pulse 84   Temp 98.2 F (36.8 C)   Resp 18   Ht 5\' 9"  (1.753 m)   Wt 90.7 kg (200 lb)   SpO2 100%   BMI 29.53 kg/m   Physical Exam  Constitutional: She appears well-developed and well-nourished.  HENT:  Head: Normocephalic.  Cardiovascular: Normal rate.  Pulmonary/Chest: Effort normal.  Abdominal: Soft.  Musculoskeletal: She exhibits tenderness.  Tender right flank and right lower lumbar spine.   Neurological: She is alert.  Skin: Skin is warm.  Nursing note and vitals reviewed.    ED Treatments / Results  Labs (all labs  ordered are listed, but only abnormal results are displayed) Labs Reviewed  URINALYSIS, ROUTINE W REFLEX MICROSCOPIC    EKG None  Radiology No results found.  Procedures Procedures (including critical care time)  Medications Ordered in ED Medications - No data to display   Initial Impression / Assessment and Plan / ED Course  I have reviewed the triage vital signs and the nursing notes.  Pertinent  labs & imaging results that were available during my care of the patient were reviewed by me and considered in my medical decision making (see chart for details).     MDM  ua and ls spine ordered.   Final Clinical Impressions(s) / ED Diagnoses   Final diagnoses:  None    ED Discharge Orders    None       Osie Cheeks 02/08/18 Heloise Beecham, MD 02/08/18 [phone]

## 2018-10-05 ENCOUNTER — Encounter (HOSPITAL_COMMUNITY): Payer: Self-pay | Admitting: *Deleted

## 2018-10-05 ENCOUNTER — Emergency Department (HOSPITAL_COMMUNITY)
Admission: EM | Admit: 2018-10-05 | Discharge: 2018-10-05 | Disposition: A | Discharge status: Home / Self Care | Payer: Self-pay | Attending: Emergency Medicine | Admitting: Emergency Medicine

## 2018-10-05 ENCOUNTER — Other Ambulatory Visit: Payer: Self-pay

## 2018-10-05 DIAGNOSIS — J069 Acute upper respiratory infection, unspecified: Secondary | ICD-10-CM | POA: Insufficient documentation

## 2018-10-05 DIAGNOSIS — F17210 Nicotine dependence, cigarettes, uncomplicated: Secondary | ICD-10-CM | POA: Insufficient documentation

## 2018-10-05 DIAGNOSIS — Z79899 Other long term (current) drug therapy: Secondary | ICD-10-CM | POA: Insufficient documentation

## 2018-10-05 DIAGNOSIS — I10 Essential (primary) hypertension: Secondary | ICD-10-CM | POA: Insufficient documentation

## 2018-10-05 DIAGNOSIS — J45909 Unspecified asthma, uncomplicated: Secondary | ICD-10-CM | POA: Insufficient documentation

## 2018-10-05 MED ORDER — CETIRIZINE-PSEUDOEPHEDRINE ER 5-120 MG PO TB12: 1.0000 | ORAL_TABLET | Freq: Every day | ORAL | 0 refills | Status: DC

## 2018-10-05 MED ORDER — FLUTICASONE PROPIONATE 50 MCG/ACT NA SUSP: 2.0000 | Freq: Every day | NASAL | 2 refills | Status: DC

## 2018-10-05 MED ORDER — BENZONATATE 100 MG PO CAPS: 100.0000 mg | ORAL_CAPSULE | Freq: Three times a day (TID) | ORAL | 0 refills | Status: DC

## 2018-10-05 MED ORDER — SALINE 0.9 % NA AERS: 1.0000 | INHALATION_SPRAY | NASAL | 0 refills | Status: DC | PRN

## 2018-10-05 NOTE — Discharge Instructions (Addendum)
Use Flonase once daily for your nasal swelling and congestion.  Simply Saline as needed throughout the day for nasal swelling congestion.  Take Zyrtec-D once daily for nasal congestion.  Do not combine this with Sudafed.  If you develop cough, you can take Tessalon every 8 hours as needed for cough.  Please return the emergency department if you develop any new or worsening symptoms.  Your blood pressure was elevated today.  Please follow-up with your primary care doctor for blood pressure recheck and continue taking your medications as prescribed.

## 2018-10-05 NOTE — ED Provider Notes (Signed)
[facility]GENCY [facility]PARTMENT Provider Note   CSN: [id number] Arrival date & time: 10/05/18  2103     History   Chief Complaint Chief Complaint  Patient presents with  . Nasal Congestion    HPI Mariah Bell is a 42 y.o. female who presents with hypertension, asthma who presents with a 1 day history of nasal congestion.  She reports she cannot breathe out of the left side of her nose at all and it is swollen and somewhat painful.  She coughed up some green stuff earlier.  She took Sudafed at home without relief.  She denies any chest pain, shortness of breath, ear pain, sore throat, fevers.  HPI  Past Medical History:  Diagnosis Date  . Arthritis   . Asthma   . Chronic back pain   . Chronic hip pain   . Hypertension     Patient Active Problem List   Diagnosis Date Noted  . 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 Trochanteric bursitis, right hip 07/11/2017  . Avascular necrosis of bone of right hip (HCC) 10/09/2015  . 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 TONSILLECTOMY    . TOTAL HIP ARTHROPLASTY Right 10/09/2015   Procedure: RIGHT TOTAL HIP ARTHROPLASTY ANTERIOR APPROACH;  Surgeon: Kathryne Hitchhristopher Y Blackman, MD;  Location: [facility];  Service: Orthopedics;  Laterality: Right;  . TUBAL LIGATION       OB History    Gravida  3   Para  2   Term  1   Preterm  1   AB  1   Living        SAB      TAB  1   Ectopic      Multiple      Live Births               Home Medications    Prior to Admission medications   Medication Sig Start Date End Date Taking? Authorizing Provider  albuterol (PROVENTIL HFA;VENTOLIN HFA) 108 (90 BASE) MCG/ACT inhaler Inhale 2 puffs into the lungs every 6 (six) hours as needed for wheezing or shortness of breath.    [provider]  amLODipine (NORVASC) 5 MG tablet Take 5 mg by mouth daily.     [provider]  aspirin EC 325 MG EC tablet Take 1 tablet (325 mg total) by mouth 2 (two) times daily after a meal. 10/11/15   Chestine Sporelark, Allayne GitelmanGilbert W, PA-C  benzonatate (TESSALON) 100 MG capsule Take 1 capsule (100 mg total) by mouth every 8 (eight) hours. 10/05/18   Nellie Pester, Waylan BogaAlexandra M, PA-C  cetirizine-pseudoephedrine (ZYRTEC-D) 5-120 MG tablet Take 1 tablet by mouth daily. 10/05/18   Jayanth Szczesniak, Waylan BogaAlexandra M, PA-C  clindamycin (CLEOCIN) 300 MG capsule Take 1 capsule (300 mg total) by mouth 3 (three) times daily. Take with food Patient not taking: Reported on 03/29/2017 04/23/16   Ivery QualeBryant, Hobson, PA-C  cyclobenzaprine (FLEXERIL) 5 MG tablet Take 1 tablet (5 mg total) by mouth 3 (three) times daily as needed (muscle soreness). 02/01/18   Devoria AlbeKnapp, Iva, MD  diphenhydrAMINE (BENADRYL) 25 MG tablet Take 1 tablet (25 mg total) by mouth every 6 (six) hours. 01/24/16   Kirichenko, Tatyana, PA-C  famotidine (PEPCID) 20 MG tablet Take 1 tablet (20 mg total) by mouth 2 (two) times daily. 01/24/16   Kirichenko, Tatyana, PA-C  fluticasone (FLONASE) 50 MCG/ACT nasal spray Place 2 sprays  into both nostrils daily. 10/05/18   Dwana Garin, Waylan Boga, PA-C  HYDROcodone-acetaminophen (NORCO/VICODIN) 5-325 MG tablet Take 1 tablet by mouth 3 (three) times daily as needed for moderate pain. 08/11/17   Kathryne Hitch, MD  ibuprofen (ADVIL,MOTRIN) 800 MG tablet Take 1 tablet (800 mg total) by mouth every 8 (eight) hours as needed for mild pain. 09/11/16   Ward, Layla Maw, DO  lisinopril-hydrochlorothiazide (PRINZIDE,ZESTORETIC) 20-12.5 MG per tablet Take 1 tablet by mouth daily.  01/28/14   [provider]  methocarbamol (ROBAXIN) 500 MG tablet Take 2 tablets (1,000 mg total) by mouth 4 (four) times daily as needed for muscle spasms (muscle spasm/pain). 03/29/17   Samuel Jester, DO  methocarbamol (ROBAXIN) 500 MG tablet Take 2 tablets (1,000 mg total) by mouth 4 (four) times daily. 03/29/17   Mancel Bale, MD  naproxen  (NAPROSYN) 250 MG tablet Take 1 po BID with food prn pain 02/01/18   Devoria Albe, MD  oxyCODONE-acetaminophen (ROXICET) 5-325 MG tablet Take 1-2 tablets by mouth every 4 (four) hours as needed for severe pain. Patient not taking: Reported on 03/29/2017 10/11/15   Kirtland Bouchard, PA-C  predniSONE (DELTASONE) 10 MG tablet Take 2 tablets (20 mg total) by mouth daily. Patient not taking: Reported on 03/29/2017 01/24/16   [name], PA-C  predniSONE (DELTASONE) 10 MG tablet Take 5 tab day 1, take 4 tab day 2, take 3 tab day 3, take 2 tab day 4, and take 1 tab day 5 Patient not taking: Reported on 03/29/2017 01/24/16   Kirichenko, Lemont Fillers, PA-C  Saline (SIMPLY SALINE) 0.9 % AERS Place 1 spray into the nose as needed. 10/05/18   Mily Malecki, Waylan Boga, PA-C  traMADol (ULTRAM) 50 MG tablet Take 1 tablet (50 mg total) by mouth every 6 (six) hours as needed for moderate pain or severe pain. 03/29/17   Samuel Jester, DO  traMADol (ULTRAM) 50 MG tablet Take 1 tablet (50 mg total) by mouth every 6 (six) hours as needed. 03/29/17   Mancel Bale, MD  zolpidem (AMBIEN) 10 MG tablet Take 10 mg by mouth at bedtime.  06/09/14   [provider]    Family History Family History  Problem Relation Age of Onset  . Diabetes Mother     Social History Social History   Tobacco Use  . Smoking status: Current Every Day Smoker    Packs/day: 0.50    Types: Cigarettes  . Smokeless tobacco: Never Used  Substance Use Topics  . Alcohol use: No  . Drug use: No     Allergies   Patient has no known allergies.   Review of Systems Review of Systems  Constitutional: Negative for chills and fever.  HENT: Positive for congestion. Negative for facial swelling and sore throat.   Respiratory: Positive for cough (very sparse). Negative for shortness of breath.   Cardiovascular: Negative for chest pain.  Gastrointestinal: Negative for abdominal pain, nausea and vomiting.  Genitourinary: Negative for dysuria.    Musculoskeletal: Negative for back pain.  Skin: Negative for rash and wound.  Neurological: Negative for headaches.  Psychiatric/Behavioral: The patient is not nervous/anxious.      Physical Exam Updated Vital Signs BP (!) 165/104 (BP Location: Right Arm)   Pulse 77   Temp 98 F (36.7 C) (Oral)   Resp (!) 22   Ht 5\' 9"  (1.753 m)   Wt 89.8 kg   LMP 10/04/2018   SpO2 99%   BMI 29.24 kg/m   Physical Exam  Constitutional:  She appears well-developed and well-nourished. No distress.  HENT:  Head: Normocephalic and atraumatic.  Mouth/Throat: Oropharynx is clear and moist. No oropharyngeal exudate.  Edematous and pink turbinates bilaterally, patent; no tenderness to palpation on the sinuses  Eyes: Pupils are equal, round, and reactive to light. Conjunctivae are normal. Right eye exhibits no discharge. Left eye exhibits no discharge. No scleral icterus.  Neck: Normal range of motion. Neck supple. No thyromegaly present.  Cardiovascular: Normal rate, regular rhythm, normal heart sounds and intact distal pulses. Exam reveals no gallop and no friction rub.  No murmur heard. Pulmonary/Chest: Effort normal and breath sounds normal. No stridor. No respiratory distress. She has no wheezes. She has no rales.  Abdominal: Soft. Bowel sounds are normal. She exhibits no distension. There is no tenderness. There is no rebound and no guarding.  Musculoskeletal: She exhibits no edema.  Lymphadenopathy:    She has no cervical adenopathy.  Neurological: She is alert. Coordination normal.  Skin: Skin is warm and dry. No rash noted. She is not diaphoretic. No pallor.  Psychiatric: She has a normal mood and affect.  Nursing note and vitals reviewed.    ED Treatments / Results  Labs (all labs ordered are listed, but only abnormal results are displayed) Labs Reviewed - No data to display  EKG None  Radiology No results found.  Procedures Procedures (including critical care  time)  Medications Ordered in ED Medications - No data to display   Initial Impression / Assessment and Plan / ED Course  I have reviewed the triage vital signs and the nursing notes.  Pertinent labs & imaging results that were available during my care of the patient were reviewed by me and considered in my medical decision making (see chart for details).     Patient presenting with nasal congestion.  Probably the start of a viral URI.  Lungs are clear.  She denies any cough except for one time when she coughed up green sputum.  No indication for imaging at this time.  Will treat with Flonase, nasal saline, Zyrtec-D, and Tessalon if cough develops.  Patient advised not to combine Zyrtec-D with Sudafed.  Follow-up to PCP for recheck of blood pressure.  Return precautions discussed.  Patient understands and agrees with plan.  Patient discharged in satisfactory condition.  Final Clinical Impressions(s) / ED Diagnoses   Final diagnoses:  Upper respiratory tract infection, unspecified type    ED Discharge Orders         Ordered    fluticasone (FLONASE) 50 MCG/ACT nasal spray  Daily     10/05/18 2132    Saline (SIMPLY SALINE) 0.9 % AERS  As needed     10/05/18 2132    benzonatate (TESSALON) 100 MG capsule  Every 8 hours     10/05/18 2132    cetirizine-pseudoephedrine (ZYRTEC-D) 5-120 MG tablet  Daily     10/05/18 2133           Emi Holes, PA-C 10/05/18 2137    Bethann Berkshire, MD 10/06/18 1504

## 2018-10-05 NOTE — ED Triage Notes (Signed)
C/o nasal congestion and swelling onset this am

## 2018-10-30 ENCOUNTER — Emergency Department (HOSPITAL_COMMUNITY)
Admission: EM | Admit: 2018-10-30 | Discharge: 2018-10-30 | Disposition: A | Discharge status: Home / Self Care | Payer: Self-pay | Attending: Emergency Medicine | Admitting: Emergency Medicine

## 2018-10-30 ENCOUNTER — Encounter (HOSPITAL_COMMUNITY): Payer: Self-pay | Admitting: *Deleted

## 2018-10-30 ENCOUNTER — Other Ambulatory Visit: Payer: Self-pay

## 2018-10-30 ENCOUNTER — Emergency Department (HOSPITAL_COMMUNITY): Payer: Self-pay

## 2018-10-30 DIAGNOSIS — J45909 Unspecified asthma, uncomplicated: Secondary | ICD-10-CM | POA: Insufficient documentation

## 2018-10-30 DIAGNOSIS — I10 Essential (primary) hypertension: Secondary | ICD-10-CM | POA: Insufficient documentation

## 2018-10-30 DIAGNOSIS — Z7982 Long term (current) use of aspirin: Secondary | ICD-10-CM | POA: Insufficient documentation

## 2018-10-30 DIAGNOSIS — M5431 Sciatica, right side: Secondary | ICD-10-CM | POA: Insufficient documentation

## 2018-10-30 DIAGNOSIS — Z79899 Other long term (current) drug therapy: Secondary | ICD-10-CM | POA: Insufficient documentation

## 2018-10-30 DIAGNOSIS — F17210 Nicotine dependence, cigarettes, uncomplicated: Secondary | ICD-10-CM | POA: Insufficient documentation

## 2018-10-30 MED ORDER — METHOCARBAMOL 500 MG PO TABS: 500.0000 mg | ORAL_TABLET | Freq: Three times a day (TID) | ORAL | 0 refills | Status: DC | PRN

## 2018-10-30 MED ORDER — KETOROLAC TROMETHAMINE 60 MG/2ML IM SOLN
60.0000 mg | Freq: Once | INTRAMUSCULAR | Status: AC
  Administered 2018-10-30: 60 mg via INTRAMUSCULAR
  Filled 2018-10-30: qty 2

## 2018-10-30 MED ORDER — METHYLPREDNISOLONE 4 MG PO TBPK: ORAL_TABLET | ORAL | 0 refills | Status: DC

## 2018-10-30 NOTE — ED Notes (Signed)
Pt ambulatory to waiting room. Pt verbalized understanding of discharge instructions.   

## 2018-10-30 NOTE — ED Triage Notes (Signed)
Pt c/o lower back pain x 2 days with pain radiating down her right hip; pt states she moved some boxes on a pallet and may have injured herself

## 2018-10-30 NOTE — ED Provider Notes (Signed)
[facility] Provider Note   CSN: [id number] Arrival date & time: 10/30/18  0018     History   Chief Complaint Chief Complaint  Patient presents with  . Back Pain    HPI Mariah Bell is a 42 y.o. female.  Patient presents to the emergency department for evaluation of back and hip pain.  Patient reports that she has been experiencing pain in the right side of her lower back going down through her right hip and upper leg area for 2 days.  Pain began after moving a heavy box.  Patient concerned because she has a history of right hip replacement and was told that it might be "coming loose".  Patient has not had any change in bowel or bladder function, numbness or tingling of lower extremities, weakness of lower extremities.     Past Medical History:  Diagnosis Date  . Arthritis   . Asthma   . Chronic back pain   . Chronic hip pain   . Hypertension     Patient Active Problem List   Diagnosis Date Noted  . 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 Trochanteric bursitis, right hip 07/11/2017  . Avascular necrosis of bone of right hip (HCC) 10/09/2015  . 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 TONSILLECTOMY    . TOTAL HIP ARTHROPLASTY Right 10/09/2015   Procedure: RIGHT TOTAL HIP ARTHROPLASTY ANTERIOR APPROACH;  Surgeon: Kathryne Hitchhristopher Y Blackman, MD;  Location: [facility];  Service: Orthopedics;  Laterality: Right;  . TUBAL LIGATION       OB History    Gravida  3   Para  2   Term  1   Preterm  1   AB  1   Living        SAB      TAB  1   Ectopic      Multiple      Live Births               Home Medications    Prior to Admission medications   Medication Sig Start Date End Date Taking? Authorizing Provider  albuterol (PROVENTIL HFA;VENTOLIN HFA) 108 (90 BASE) MCG/ACT inhaler Inhale 2 puffs into the lungs every 6 (six)  hours as needed for wheezing or shortness of breath.    [provider]  amLODipine (NORVASC) 5 MG tablet Take 5 mg by mouth daily.    [provider]  aspirin EC 325 MG EC tablet Take 1 tablet (325 mg total) by mouth 2 (two) times daily after a meal. 10/11/15   Chestine Sporelark, Allayne GitelmanGilbert W, PA-C  benzonatate (TESSALON) 100 MG capsule Take 1 capsule (100 mg total) by mouth every 8 (eight) hours. 10/05/18   Law, Waylan BogaAlexandra M, PA-C  cetirizine-pseudoephedrine (ZYRTEC-D) 5-120 MG tablet Take 1 tablet by mouth daily. 10/05/18   Law, Waylan BogaAlexandra M, PA-C  clindamycin (CLEOCIN) 300 MG capsule Take 1 capsule (300 mg total) by mouth 3 (three) times daily. Take with food Patient not taking: Reported on 03/29/2017 04/23/16   Ivery QualeBryant, Hobson, PA-C  cyclobenzaprine (FLEXERIL) 5 MG tablet Take 1 tablet (5 mg total) by mouth 3 (three) times daily as needed (muscle soreness). 02/01/18   Devoria AlbeKnapp, Iva, MD  diphenhydrAMINE (BENADRYL) 25 MG tablet Take 1 tablet (25 mg total) by mouth every 6 (six) hours. 01/24/16   Kirichenko, Tatyana, PA-C  famotidine (PEPCID) 20 MG tablet  Take 1 tablet (20 mg total) by mouth 2 (two) times daily. 01/24/16   Kirichenko, Tatyana, PA-C  fluticasone (FLONASE) 50 MCG/ACT nasal spray Place 2 sprays into both nostrils daily. 10/05/18   Law, Waylan Boga, PA-C  HYDROcodone-acetaminophen (NORCO/VICODIN) 5-325 MG tablet Take 1 tablet by mouth 3 (three) times daily as needed for moderate pain. 08/11/17   Kathryne Hitch, MD  ibuprofen (ADVIL,MOTRIN) 800 MG tablet Take 1 tablet (800 mg total) by mouth every 8 (eight) hours as needed for mild pain. 09/11/16   Ward, Layla Maw, DO  lisinopril-hydrochlorothiazide (PRINZIDE,ZESTORETIC) 20-12.5 MG per tablet Take 1 tablet by mouth daily.  01/28/14   [provider]  methocarbamol (ROBAXIN) 500 MG tablet Take 1 tablet (500 mg total) by mouth every 8 (eight) hours as needed for muscle spasms. 10/30/18   Gilda Crease, MD    methylPREDNISolone (MEDROL DOSEPAK) 4 MG TBPK tablet As directed 10/30/18   Gilda Crease, MD  naproxen (NAPROSYN) 250 MG tablet Take 1 po BID with food prn pain 02/01/18   Devoria Albe, MD  Saline (SIMPLY SALINE) 0.9 % AERS Place 1 spray into the nose as needed. 10/05/18   Law, Waylan Boga, PA-C  traMADol (ULTRAM) 50 MG tablet Take 1 tablet (50 mg total) by mouth every 6 (six) hours as needed for moderate pain or severe pain. 03/29/17   Samuel Jester, DO  traMADol (ULTRAM) 50 MG tablet Take 1 tablet (50 mg total) by mouth every 6 (six) hours as needed. 03/29/17   Mancel Bale, MD  zolpidem (AMBIEN) 10 MG tablet Take 10 mg by mouth at bedtime.  06/09/14   [provider]    Family History Family History  Problem Relation Age of Onset  . Diabetes Mother     Social History Social History   Tobacco Use  . Smoking status: Current Every Day Smoker    Packs/day: 0.50    Types: Cigarettes  . Smokeless tobacco: Never Used  Substance Use Topics  . Alcohol use: No  . Drug use: No     Allergies   Patient has no known allergies.   Review of Systems Review of Systems  Musculoskeletal: Positive for arthralgias and back pain.  All other systems reviewed and are negative.    Physical Exam Updated Vital Signs BP (!) 167/111 (BP Location: Left Arm)   Pulse 72   Temp 98.3 F (36.8 C) (Oral)   Resp 18   Ht 5\' 9"  (1.753 m)   Wt 89.8 kg   LMP 10/04/2018   BMI 29.24 kg/m   Physical Exam Vitals signs and nursing note reviewed.  Constitutional:      General: She is not in acute distress.    Appearance: Normal appearance. She is well-developed.  HENT:     Head: Normocephalic and atraumatic.     Right Ear: Hearing normal.     Left Ear: Hearing normal.     Nose: Nose normal.  Eyes:     Conjunctiva/sclera: Conjunctivae normal.     Pupils: Pupils are equal, round, and reactive to light.  Neck:     Musculoskeletal: Normal range of motion and neck supple.   Cardiovascular:     Rate and Rhythm: Regular rhythm.     Heart sounds: S1 normal and S2 normal. No murmur. No friction rub. No gallop.   Pulmonary:     Effort: Pulmonary effort is normal. No respiratory distress.     Breath sounds: Normal breath sounds.  Chest:  Chest wall: No tenderness.  Abdominal:     General: Bowel sounds are normal.     Palpations: Abdomen is soft.     Tenderness: There is no abdominal tenderness. There is no guarding or rebound. Negative signs include Murphy's sign and McBurney's sign.     Hernia: No hernia is present.  Musculoskeletal: Normal range of motion.     Comments: Pain reproduced in the lower back with flexion of the right hip to 70 degrees.  Normal strength and sensation bilateral lower extremities  No saddle anesthesia  Skin:    General: Skin is warm and dry.     Findings: No rash.  Neurological:     Mental Status: She is alert and oriented to person, place, and time.     GCS: GCS eye subscore is 4. GCS verbal subscore is 5. GCS motor subscore is 6.     Cranial Nerves: No cranial nerve deficit.     Sensory: No sensory deficit.     Coordination: Coordination normal.     Deep Tendon Reflexes:     Reflex Scores:      Patellar reflexes are 1+ on the right side and 1+ on the left side. Psychiatric:        Speech: Speech normal.        Behavior: Behavior normal.        Thought Content: Thought content normal.      ED Treatments / Results  Labs (all labs ordered are listed, but only abnormal results are displayed) Labs Reviewed - No data to display  EKG None  Radiology Dg Lumbar Spine Complete  Result Date: 10/30/2018 CLINICAL DATA:  Lumbosacral back pain radiating down right hip. Possible injury moving boxes. EXAM: LUMBAR SPINE - COMPLETE 4+ VIEW COMPARISON:  Radiographs 02/08/2018 FINDINGS: The alignment is maintained. Vertebral body heights are normal. There is no listhesis. The posterior elements are intact. Mild disc space  narrowing at L3-L4 with associated endplate spurring, tiny additional osteophytes at additional levels. No fracture. Sacroiliac joints are symmetric and normal. IMPRESSION: Mild spondylosis of the lumbar spine without acute osseous abnormality. Electronically Signed   By: Narda Rutherford M.D.   On: 10/30/2018 02:10   Dg Hip Unilat W Or Wo Pelvis 2-3 Views Right  Result Date: 10/30/2018 CLINICAL DATA:  Lumbosacral back pain radiating down right hip for 2 days. Possible injury moving boxes. EXAM: DG HIP (WITH OR WITHOUT PELVIS) 2-3V RIGHT COMPARISON:  Radiograph 03/29/2017 FINDINGS: Right hip arthroplasty in place. There is similar appearing lucency about the trochanteric portion of the femoral stem compared to prior exam. Well-circumscribed lucency in the right superior acetabulum adjacent to the acetabular cup was present on preoperative radiographs and consistent with subchondral cysts. No acute fracture. Pubic rami are intact. Pubic symphysis and sacroiliac joints are congruent. IMPRESSION: 1. No acute fracture or acute osseous abnormality. 2. Right hip arthroplasty unchanged in appearance to prior exam. Lucency about the proximal femoral stem component in the intertrochanteric region is unchanged from 2018 radiographs and suspicious for loosening. Electronically Signed   By: Narda Rutherford M.D.   On: 10/30/2018 02:08    Procedures Procedures (including critical care time)  Medications Ordered in ED Medications  ketorolac (TORADOL) injection 60 mg (60 mg Intramuscular Given 10/30/18 0234)     Initial Impression / Assessment and Plan / ED Course  I have reviewed the triage vital signs and the nursing notes.  Pertinent labs & imaging results that were available during my care of the  patient were reviewed by me and considered in my medical decision making (see chart for details).     Patient presents to the emergency part with complaints of back pain radiating to the right leg.  No saddle  anesthesia or signs of cauda equina syndrome.  Patient reports that she has been told that she has had some problems with her right hip replacement in the past, x-rays were therefore performed.  X-rays are consistent with previous x-rays, no acute abnormality.  Patient will be treated for acute sciatica.  Final Clinical Impressions(s) / ED Diagnoses   Final diagnoses:  Sciatica of right side    ED Discharge Orders         Ordered    methylPREDNISolone (MEDROL DOSEPAK) 4 MG TBPK tablet     10/30/18 0216    methocarbamol (ROBAXIN) 500 MG tablet  Every 8 hours PRN     10/30/18 0216           Gilda Crease, MD 10/30/18 [phone]

## 2019-06-25 ENCOUNTER — Encounter (HOSPITAL_COMMUNITY): Payer: Self-pay | Admitting: Emergency Medicine

## 2019-06-25 ENCOUNTER — Emergency Department (HOSPITAL_COMMUNITY)
Admission: EM | Admit: 2019-06-25 | Discharge: 2019-06-25 | Disposition: A | Discharge status: Home / Self Care | Payer: Self-pay | Attending: Emergency Medicine | Admitting: Emergency Medicine

## 2019-06-25 ENCOUNTER — Emergency Department (HOSPITAL_COMMUNITY): Payer: Self-pay

## 2019-06-25 ENCOUNTER — Other Ambulatory Visit: Payer: Self-pay

## 2019-06-25 DIAGNOSIS — I10 Essential (primary) hypertension: Secondary | ICD-10-CM | POA: Insufficient documentation

## 2019-06-25 DIAGNOSIS — Z7982 Long term (current) use of aspirin: Secondary | ICD-10-CM | POA: Insufficient documentation

## 2019-06-25 DIAGNOSIS — M25551 Pain in right hip: Secondary | ICD-10-CM | POA: Insufficient documentation

## 2019-06-25 DIAGNOSIS — J45909 Unspecified asthma, uncomplicated: Secondary | ICD-10-CM | POA: Insufficient documentation

## 2019-06-25 DIAGNOSIS — Z79899 Other long term (current) drug therapy: Secondary | ICD-10-CM | POA: Insufficient documentation

## 2019-06-25 DIAGNOSIS — G8929 Other chronic pain: Secondary | ICD-10-CM | POA: Insufficient documentation

## 2019-06-25 DIAGNOSIS — Z96641 Presence of right artificial hip joint: Secondary | ICD-10-CM | POA: Insufficient documentation

## 2019-06-25 DIAGNOSIS — F17210 Nicotine dependence, cigarettes, uncomplicated: Secondary | ICD-10-CM | POA: Insufficient documentation

## 2019-06-25 LAB — POCT PREGNANCY, URINE: Preg Test, Ur: NEGATIVE

## 2019-06-25 MED ORDER — PREDNISONE 20 MG PO TABS: 40.0000 mg | ORAL_TABLET | Freq: Every day | ORAL | 0 refills | Status: AC

## 2019-06-25 MED ORDER — NAPROXEN 500 MG PO TABS: 500.0000 mg | ORAL_TABLET | Freq: Two times a day (BID) | ORAL | 0 refills | Status: AC

## 2019-06-25 MED ORDER — OXYCODONE-ACETAMINOPHEN 5-325 MG PO TABS
1.0000 | ORAL_TABLET | Freq: Once | ORAL | Status: AC
  Administered 2019-06-25: 14:00:00 1 via ORAL
  Filled 2019-06-25: qty 1

## 2019-06-25 NOTE — ED Triage Notes (Signed)
Patient complaining of right hip pain x1 week. Denies injury.

## 2019-06-25 NOTE — ED Provider Notes (Signed)
[facility] Provider Note   CSN: [id number] Arrival date & time: 06/25/19  1039    History   Chief Complaint Chief Complaint  Patient presents with  . Hip Pain    HPI Mariah Bell is a 43 y.o. female.     43 y.o female with a PMH of right hip replacement presents to the ED with a chief complaint acute on chronic right hip pain x 1 week. Patient reports a burning sensation to the lateral aspect of her right hip with radiation into her knee. States her symptoms are worse with ambulation and palpation.She reports the pain has been present since after her hip replacement, states she was seen by Orthopedist and advised she likely need a revision but she does not wish to have it done for now. She has tried ice and heat to the area without improvement in symptoms. She denies any trauma, fever, bowel or bladder incontinence.   The history is provided by the patient.  Hip Pain Pertinent negatives include no chest pain, no abdominal pain and no shortness of breath.    Past Medical History:  Diagnosis Date  . Arthritis   . Asthma   . Chronic back pain   . Chronic hip pain   . Hypertension     Patient Active Problem List   Diagnosis Date Noted  . 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 Trochanteric bursitis, right hip 07/11/2017  . Avascular necrosis of bone of right hip (HCC) 10/09/2015  . 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 TONSILLECTOMY    . TOTAL HIP ARTHROPLASTY Right 10/09/2015   Procedure: RIGHT TOTAL HIP ARTHROPLASTY ANTERIOR APPROACH;  Surgeon: Kathryne Hitchhristopher Y Blackman, MD;  Location: [facility];  Service: Orthopedics;  Laterality: Right;  . TUBAL LIGATION       OB History    Gravida  3   Para  2   Term  1   Preterm  1   AB  1   Living        SAB      TAB  1   Ectopic      Multiple      Live Births                Home Medications    Prior to Admission medications   Medication Sig Start Date End Date Taking? Authorizing Provider  albuterol (PROVENTIL HFA;VENTOLIN HFA) 108 (90 BASE) MCG/ACT inhaler Inhale 2 puffs into the lungs every 6 (six) hours as needed for wheezing or shortness of breath.    [provider]  amLODipine (NORVASC) 5 MG tablet Take 5 mg by mouth daily.    [provider]  aspirin EC 325 MG EC tablet Take 1 tablet (325 mg total) by mouth 2 (two) times daily after a meal. 10/11/15   Chestine Sporelark, Allayne GitelmanGilbert W, PA-C  benzonatate (TESSALON) 100 MG capsule Take 1 capsule (100 mg total) by mouth every 8 (eight) hours. 10/05/18   Law, Waylan BogaAlexandra M, PA-C  cetirizine-pseudoephedrine (ZYRTEC-D) 5-120 MG tablet Take 1 tablet by mouth daily. 10/05/18   Law, Waylan BogaAlexandra M, PA-C  clindamycin (CLEOCIN) 300 MG capsule Take 1 capsule (300 mg total) by mouth 3 (three) times daily. Take with food Patient not taking: Reported on 03/29/2017 04/23/16   Ivery QualeBryant, Hobson, PA-C  cyclobenzaprine (FLEXERIL) 5 MG tablet Take 1 tablet (5 mg total) by mouth  3 (three) times daily as needed (muscle soreness). 02/01/18   Devoria AlbeKnapp, Iva, MD  diphenhydrAMINE (BENADRYL) 25 MG tablet Take 1 tablet (25 mg total) by mouth every 6 (six) hours. 01/24/16   Kirichenko, Tatyana, PA-C  famotidine (PEPCID) 20 MG tablet Take 1 tablet (20 mg total) by mouth 2 (two) times daily. 01/24/16   Kirichenko, Tatyana, PA-C  fluticasone (FLONASE) 50 MCG/ACT nasal spray Place 2 sprays into both nostrils daily. 10/05/18   Law, Waylan BogaAlexandra M, PA-C  HYDROcodone-acetaminophen (NORCO/VICODIN) 5-325 MG tablet Take 1 tablet by mouth 3 (three) times daily as needed for moderate pain. 08/11/17   Kathryne HitchBlackman, Christopher Y, MD  ibuprofen (ADVIL,MOTRIN) 800 MG tablet Take 1 tablet (800 mg total) by mouth every 8 (eight) hours as needed for mild pain. 09/11/16   Ward, Layla MawKristen N, DO  lisinopril-hydrochlorothiazide (PRINZIDE,ZESTORETIC) 20-12.5 MG per tablet Take 1  tablet by mouth daily.  01/28/14   [provider]  methocarbamol (ROBAXIN) 500 MG tablet Take 1 tablet (500 mg total) by mouth every 8 (eight) hours as needed for muscle spasms. 10/30/18   Gilda CreasePollina, Christopher J, MD  methylPREDNISolone (MEDROL DOSEPAK) 4 MG TBPK tablet As directed 10/30/18   Pollina, Canary Brimhristopher J, MD  naproxen (NAPROSYN) 500 MG tablet Take 1 tablet (500 mg total) by mouth 2 (two) times daily for 7 days. 06/25/19 07/02/19  Claude MangesSoto, Rodman Recupero, PA-C  predniSONE (DELTASONE) 20 MG tablet Take 2 tablets (40 mg total) by mouth daily for 5 days. 06/25/19 06/30/19  Claude MangesSoto, Glennette Galster, PA-C  Saline (SIMPLY SALINE) 0.9 % AERS Place 1 spray into the nose as needed. 10/05/18   Law, Waylan BogaAlexandra M, PA-C  traMADol (ULTRAM) 50 MG tablet Take 1 tablet (50 mg total) by mouth every 6 (six) hours as needed for moderate pain or severe pain. 03/29/17   Samuel JesterMcManus, Kathleen, DO  traMADol (ULTRAM) 50 MG tablet Take 1 tablet (50 mg total) by mouth every 6 (six) hours as needed. 03/29/17   Mancel BaleWentz, Elliott, MD  zolpidem (AMBIEN) 10 MG tablet Take 10 mg by mouth at bedtime.  06/09/14   [provider]    Family History Family History  Problem Relation Age of Onset  . Diabetes Mother     Social History Social History   Tobacco Use  . Smoking status: Current Every Day Smoker    Packs/day: 0.50    Types: Cigarettes  . Smokeless tobacco: Never Used  Substance Use Topics  . Alcohol use: No  . Drug use: No     Allergies   Patient has no known allergies.   Review of Systems Review of Systems  Constitutional: Negative for chills and fever.  HENT: Negative for ear pain and sore throat.   Eyes: Negative for pain and visual disturbance.  Respiratory: Negative for cough and shortness of breath.   Cardiovascular: Negative for chest pain and palpitations.  Gastrointestinal: Negative for abdominal pain and vomiting.  Genitourinary: Negative for dysuria and hematuria.  Musculoskeletal: Positive for  myalgias. Negative for arthralgias and back pain.  Skin: Negative for color change and rash.  Neurological: Negative for seizures and syncope.  All other systems reviewed and are negative.    Physical Exam Updated Vital Signs BP (!) 182/111 (BP Location: Right Arm)   Pulse (!) 53   Temp (!) 97.5 F (36.4 C) (Oral)   Resp 18   Ht 5\' 9"  (1.753 m)   Wt 82.6 kg   LMP 05/29/2019   SpO2 100%   BMI 26.88 kg/m   Physical  Exam Vitals signs and nursing note reviewed.  Constitutional:      General: She is not in acute distress.    Appearance: She is well-developed.     Comments: Non-ill-appearing, eating chips during interview.  HENT:     Head: Normocephalic and atraumatic.     Mouth/Throat:     Pharynx: No oropharyngeal exudate.  Eyes:     Pupils: Pupils are equal, round, and reactive to light.  Neck:     Musculoskeletal: Normal range of motion.  Cardiovascular:     Rate and Rhythm: Regular rhythm.     Heart sounds: Normal heart sounds.  Pulmonary:     Effort: Pulmonary effort is normal. No respiratory distress.     Breath sounds: Normal breath sounds.  Abdominal:     General: Bowel sounds are normal. There is no distension.     Palpations: Abdomen is soft.     Tenderness: There is no abdominal tenderness.  Musculoskeletal:        General: No deformity.     Right hip: She exhibits tenderness. She exhibits normal range of motion, normal strength, no bony tenderness, no swelling, no crepitus, no deformity and no laceration.     Right lower leg: No edema.     Left lower leg: No edema.       Legs:     Comments: RLE- KF,KE 5/5 strength LLE- HF, HE 5/5 strength Normal gait. No pronator drift. No leg drop.  Patellar reflexes present and symmetric. CN I, II and VIII not tested. CN II-XII grossly intact bilaterally.   Full range of motion, patient in no distress during exam.  Does indicate tenderness along the right hip.  Able to fully rotate right leg.   Skin:    General:  Skin is warm and dry.  Neurological:     Mental Status: She is alert and oriented to person, place, and time.      ED Treatments / Results  Labs (all labs ordered are listed, but only abnormal results are displayed) Labs Reviewed  POC URINE PREG, ED  POCT PREGNANCY, URINE    EKG None  Radiology Dg Hip Unilat W Or Wo Pelvis 2-3 Views Right  Result Date: 06/25/2019 CLINICAL DATA:  Chronic pain EXAM: DG HIP (WITH OR WITHOUT PELVIS) 2-3V RIGHT COMPARISON:  October 30, 2018 and Mar 29, 2017 radiograph; CT right hip region Mar 29, 2017 FINDINGS: Frontal pelvis as well as frontal and lateral right hip images obtained. There is a total hip replacement on the right. There remains lucency surrounding the femoral component of the prosthesis both medially and laterally, felt to be indicative of a degree of loosening of the prosthesis. No acute fracture or dislocation evident. There is chronic cystic change in the right acetabulum, stable. There is mild narrowing of the left hip joint. No erosive change. IMPRESSION: Status post total hip replacement right with apparent loosening of the proximal femoral aspect of the prosthesis, grossly stable compared to prior studies. Cystic change in the right periacetabular region is stable. Mild narrowing left hip joint. No acute fracture or dislocation. Electronically Signed   By: Lowella Grip III M.D.   On: 06/25/2019 11:53    Procedures Procedures (including critical care time)  Medications Ordered in ED Medications  oxyCODONE-acetaminophen (PERCOCET/ROXICET) 5-325 MG per tablet 1 tablet (1 tablet Oral Given 06/25/19 1342)     Initial Impression / Assessment and Plan / ED Course  I have reviewed the triage vital signs and the nursing  notes.  Pertinent labs & imaging results that were available during my care of the patient were reviewed by me and considered in my medical decision making (see chart for details).     Patient with a previous  medical history of right hip replacement presents to the ED with acute on chronic right hip pain which began a week ago.  Patient reports the pain has been persistent since she had a total right hip replacement.  States she has not taken any medication to help with her symptoms.  She does report applying ice and heat to the area with some improvement in symptoms.  According to her chart which have extensively review, patient has multiple visits to the ED for chronic right hip pain, this seems to be exacerbated with her movement along with standing and lifting.  She was advised by Dr. Vedia CofferBlack man to have a revision of her right hip replacement, patient defer this at this time due to not feeling comfortable to go through with the procedure.  During primary relation, she has full range of motion of her right hip, pulses are intact, neurovascular intact.  She is ambulatory in the ED, was in no distress during physical exam eating chips throughout her interview.  An x-ray of her right hip was obtained to further evaluate patient is showed: Status post total hip replacement right with apparent loosening of  the proximal femoral aspect of the prosthesis, grossly stable  compared to prior studies. Cystic change in the right periacetabular  region is stable. Mild narrowing left hip joint. No acute fracture  or dislocation.   Denies any fever, trauma, bowel or bladder complaints.  Vital signs are within normal limits, she is hypertensive on arrival, according to her chart she has attempted to be seen for elevated blood pressure, currently not on any medication.  Denies any chest pain, shortness of breath, in any distress.  Patient is well-appearing, vital signs are within normal limits, x-ray is without any acute changes on chronic hip complaints.  At this time I feel patient would benefit from anti-inflammatories along with some steroids to help with her symptoms.  Not on any immunocompromise medication will try a  steroid burst along with NSAIDs.  Advised to call Dr. Magnus IvanBlackman to further manage her chronic pain or seek pain management clinic.  Patient understands and agrees with management.  Return precautions provided at length.   Portions of this note were generated with Scientist, clinical (histocompatibility and immunogenetics)Dragon dictation software. Dictation errors may occur despite best attempts at proofreading.  Final Clinical Impressions(s) / ED Diagnoses   Final diagnoses:  Chronic right hip pain    ED Discharge Orders         Ordered    predniSONE (DELTASONE) 20 MG tablet  Daily     06/25/19 1341    naproxen (NAPROSYN) 500 MG tablet  2 times daily     06/25/19 1341           Claude MangesSoto, Bita Cartwright, PA-C 06/25/19 1414    Blane OharaZavitz, Joshua, MD 06/25/19 1604

## 2019-06-25 NOTE — Discharge Instructions (Addendum)
I have also prescribed steroids, he is to be advised this medication can cause insomnia, appetite changes. Please follow-up with PCP in 1 week for reevaluation of your symptoms.     If you experience any bowel or bladder incontinence, fever, worsening in your symptoms please return to the ED.

## 2019-07-01 ENCOUNTER — Ambulatory Visit (INDEPENDENT_AMBULATORY_CARE_PROVIDER_SITE_OTHER): Payer: Self-pay | Admitting: Orthopaedic Surgery

## 2019-07-01 ENCOUNTER — Encounter: Payer: Self-pay | Admitting: Orthopaedic Surgery

## 2019-07-01 VITALS — Ht 69.0 in | Wt 192.0 lb

## 2019-07-01 DIAGNOSIS — T84030D Mechanical loosening of internal right hip prosthetic joint, subsequent encounter: Secondary | ICD-10-CM

## 2019-07-01 NOTE — Progress Notes (Signed)
Office Visit Note   Patient: Mariah Bell           Date of Birth: [birth year]           MRN: [id number] Visit Date: 07/01/2019              Requested by: No referring provider defined for this encounter. PCP: Patient, No Pcp Per   Assessment & Plan: Visit Diagnoses:  1. 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  This does not appear to be an infectious process.  We would still approach it from that standpoint in terms of assessing for infection but based on her clinical exam and x-ray findings and a long period time out from surgery this appears to be aseptic loosening.  I had a long thorough discussion about what revision surgery involves.  She understands this will be quite difficult due to the fact that the distal aspects of the component have grown into the bone.  Given that she is not obese we still may attempt a revision to a direct anterior approach but we may end up addressing this from a lateral approach.  That will be determined in the near future.  We talked about the risk and benefits of surgery.  We had a long and thorough discussion about her interoperative and postoperative course and what this involves.  All question concerns were answered and addressed.  We will work on getting surgery scheduled.  Follow-Up Instructions: Return for 2 weeks post-op.   Orders:  No orders of the defined types were placed in this encounter.  No orders of the defined types were placed in this encounter.     Procedures: No procedures performed   Clinical Data: No additional findings.   Subjective: Chief Complaint  Patient presents with  . Right Leg - Pain  The patient is well-known to me.  She has a history of a right total hip arthroplasty that we did 4 years ago on the right hip secondary to avascular necrosis.  She had done  well initially postoperatively but then developed worsening right hip pain with time.  A three-phase bone scan and CT scan did confirm prosthetic loosening with lucencies around the proximal aspect of the femoral component and acetabular component.  We last saw her in 2018 and recommended a hip revision arthroplasty.  She was worried about having this done and decided just to go without any type of surgery.  She comes in today reporting continued right hip pain and she would like to discuss surgery again.  She has had no other acute changes in her medical status.  She denies any issues with her left hip at all.  The right hip hurts all around the trochanteric area in the groin and does go down the side of her leg.  HPI  Review of Systems She currently denies any headache, chest pain, shortness of breath, fever, chills, nausea, vomiting  Objective: Vital Signs: Ht 5\' 9"  (1.753 m)   Wt 192 lb (87.1 kg)   BMI 28.35 kg/m   Physical Exam She is alert and orient x3 and in no acute distress Ortho Exam Examination of her left hip is normal examination of her right hip shows pain with the extremes of rotation and the pain is all around the proximal femur and thigh area.  There is minimal pain  to palpation around the right hip. Specialty Comments:  No specialty comments available.  Imaging: No results found. Recent x-rays of her pelvis and right hip independently reviewed show normal left hip.  The right hip does show a lucency around the acetabular component but full bony ingrowth distally at the femoral component.  PMFS History: Patient Active Problem List   Diagnosis Date Noted  . 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 Trochanteric bursitis, right hip 07/11/2017  . Avascular necrosis of bone of right hip (HCC) 10/09/2015  . Status post total replacement of right hip 10/09/2015   Past Medical History:  Diagnosis Date  . Arthritis   . Asthma   .  Chronic back pain   . Chronic hip pain   . Hypertension     Family History  Problem Relation Age of Onset  . Diabetes Mother     Past Surgical History:  Procedure Laterality Date  . TONSILLECTOMY    . TOTAL HIP ARTHROPLASTY Right 10/09/2015   Procedure: RIGHT TOTAL HIP ARTHROPLASTY ANTERIOR APPROACH;  Surgeon: Kathryne Hitchhristopher Y Aika Brzoska, MD;  Location: [facility];  Service: Orthopedics;  Laterality: Right;  . TUBAL LIGATION     Social History   Occupational History  . Not on file  Tobacco Use  . Smoking status: Current Every Day Smoker    Packs/day: 0.50    Types: Cigarettes  . Smokeless tobacco: Never Used  Substance and Sexual Activity  . Alcohol use: No  . Drug use: No  . Sexual activity: Not Currently    Birth control/protection: Surgical

## 2019-08-13 ENCOUNTER — Other Ambulatory Visit: Payer: Self-pay | Admitting: Physician Assistant

## 2019-08-15 NOTE — Pre-Procedure Instructions (Signed)
[facility] [place] [address] [place] [address] Phone: [phone] Fax: [phone]  [id number] [place], [place] - [address] [place] [address] Phone: [phone] Fax: [phone]      Your procedure is scheduled on Tuesday, October 6th.  Report to [facility] Main Entrance "A" at 10:00 A.M., and check in at the Admitting office.  Call this number if you have problems the morning of surgery:  [phone]  Call [phone] if you have any questions prior to your surgery date Monday-Friday 8am-4pm    Remember:  Do not eat after midnight the night before your surgery  You may drink clear liquids until 9:00 the morning of your surgery.   Clear liquids allowed are: Water, Non-Citrus Juices (without pulp), Carbonated Beverages, Clear Tea, Black Coffee Only, and Gatorade.  Please complete your PRE-SURGERY ENSURE that was provided to you by 9:00 the morning of surgery.  Please, if able, drink it in one setting. DO NOT SIP.   Enhanced Recovery after Surgery for Orthopedics Enhanced Recovery after Surgery is a protocol used to improve the stress on your body and your recovery after surgery.  Patient Instructions  . The night before surgery:  o No food after midnight. ONLY clear liquids after midnight  .  Marland Kitchen The day of surgery (if you do NOT have diabetes):  o Drink ONE (1) Pre-Surgery Clear Ensure as directed.   o This drink was given to you during your hospital  pre-op appointment visit. o The pre-op nurse will instruct you on the time to drink the  Pre-Surgery Ensure depending on your surgery time. o Finish the drink at the designated time by the pre-op nurse.  o Nothing else to drink after completing the  Pre-Surgery Clear Ensure.          If you have questions, please contact your surgeon's office.     Take these medicines the  morning of surgery with A SIP OF WATER: amLODipine (NORVASC) benzonatate (TESSALON) fluticasone (FLONASE)  famotidine (PEPCID)  As needed:  Albuterol inhaler-please bring with you to the hospital, cyclobenzaprine (FLEXERIL), diphenhydrAMINE (BENADRYL), HYDROcodone-acetaminophen (NORCO/VICODIN), methocarbamol (ROBAXIN), traMADol (ULTRAM).   Follow your surgeon's instructions on when to stop Aspirin.  If no instructions were given by your surgeon then you will need to call the office to get those instructions.     As of today, STOP taking any Aleve, Naproxen, Ibuprofen, Motrin, Advil, Goody's, BC's, all herbal medications, fish oil, and all vitamins.    The Morning of Surgery  Do not wear jewelry, make-up or nail polish.  Do not wear lotions, powders, or perfumes, or deodorant  Do not shave 48 hours prior to surgery.    Do not bring valuables to the hospital.  [facility] is not responsible for any belongings or valuables.  If you are a smoker, DO NOT Smoke 24 hours prior to surgery IF you wear a CPAP at night please bring your mask, tubing, and machine the morning of surgery   Remember that you must have someone to transport you home after your surgery, and remain with you for 24 hours if you are discharged the same day.   Contacts, glasses, hearing aids, dentures or bridgework may not be worn into surgery.    Leave your suitcase in the car.  After surgery it may be brought to your room.  For patients admitted to  the hospital, discharge time will be determined by your treatment team.  Patients discharged the day of surgery will not be allowed to drive home.    Special instructions:   [facility]- Preparing For Surgery  Before surgery, you can play an important role. Because skin is not sterile, your skin needs to be as free of germs as possible. You can reduce the number of germs on your skin by washing with CHG (chlorahexidine gluconate) Soap before surgery.  CHG is an  antiseptic cleaner which kills germs and bonds with the skin to continue killing germs even after washing.    Oral Hygiene is also important to reduce your risk of infection.  Remember - BRUSH YOUR TEETH THE MORNING OF SURGERY WITH YOUR REGULAR TOOTHPASTE  Please do not use if you have an allergy to CHG or antibacterial soaps. If your skin becomes reddened/irritated stop using the CHG.  Do not shave (including legs and underarms) for at least 48 hours prior to first CHG shower. It is OK to shave your face.  Please follow these instructions carefully.   1. Shower the NIGHT BEFORE SURGERY and the MORNING OF SURGERY with CHG Soap.   2. If you chose to wash your hair, wash your hair first as usual with your normal shampoo.  3. After you shampoo, rinse your hair and body thoroughly to remove the shampoo.  4. Use CHG as you would any other liquid soap. You can apply CHG directly to the skin and wash gently with a scrungie or a clean washcloth.   5. Apply the CHG Soap to your body ONLY FROM THE NECK DOWN.  Do not use on open wounds or open sores. Avoid contact with your eyes, ears, mouth and genitals (private parts). Wash Face and genitals (private parts)  with your normal soap.   6. Wash thoroughly, paying special attention to the area where your surgery will be performed.  7. Thoroughly rinse your body with warm water from the neck down.  8. DO NOT shower/wash with your normal soap after using and rinsing off the CHG Soap.  9. Pat yourself dry with a CLEAN TOWEL.  10. Wear CLEAN PAJAMAS to bed the night before surgery, wear comfortable clothes the morning of surgery  11. Place CLEAN SHEETS on your bed the night of your first shower and DO NOT SLEEP WITH PETS.    Day of Surgery:  Do not apply any deodorants/lotions. Please shower the morning of surgery with the CHG soap  Please wear clean clothes to the hospital/surgery center.   Remember to brush your teeth WITH YOUR REGULAR  TOOTHPASTE.   Please read over the following fact sheets that you were given.

## 2019-08-16 ENCOUNTER — Other Ambulatory Visit: Payer: Self-pay

## 2019-08-16 ENCOUNTER — Encounter (HOSPITAL_COMMUNITY): Payer: Self-pay

## 2019-08-16 ENCOUNTER — Encounter (HOSPITAL_COMMUNITY)
Admission: RE | Admit: 2019-08-16 | Discharge: 2019-08-16 | Disposition: A | Discharge status: Home / Self Care | Payer: PRIVATE HEALTH INSURANCE | Source: Ambulatory Visit | Attending: Orthopaedic Surgery | Admitting: Orthopaedic Surgery

## 2019-08-16 ENCOUNTER — Other Ambulatory Visit (HOSPITAL_COMMUNITY)
Admission: RE | Admit: 2019-08-16 | Discharge: 2019-08-16 | Disposition: A | Discharge status: Home / Self Care | Payer: PRIVATE HEALTH INSURANCE | Source: Ambulatory Visit | Attending: Orthopaedic Surgery | Admitting: Orthopaedic Surgery

## 2019-08-16 DIAGNOSIS — R9431 Abnormal electrocardiogram [ECG] [EKG]: Secondary | ICD-10-CM | POA: Insufficient documentation

## 2019-08-16 DIAGNOSIS — Z20828 Contact with and (suspected) exposure to other viral communicable diseases: Secondary | ICD-10-CM | POA: Diagnosis not present

## 2019-08-16 DIAGNOSIS — T84030A Mechanical loosening of internal right hip prosthetic joint, initial encounter: Secondary | ICD-10-CM | POA: Diagnosis not present

## 2019-08-16 DIAGNOSIS — Z01818 Encounter for other preprocedural examination: Secondary | ICD-10-CM | POA: Diagnosis present

## 2019-08-16 DIAGNOSIS — I10 Essential (primary) hypertension: Secondary | ICD-10-CM | POA: Insufficient documentation

## 2019-08-16 LAB — BASIC METABOLIC PANEL
Anion gap: 12 (ref 5–15)
BUN: 7 mg/dL (ref 6–20)
CO2: 21 mmol/L — ABNORMAL LOW (ref 22–32)
Calcium: 8.8 mg/dL — ABNORMAL LOW (ref 8.9–10.3)
Chloride: 106 mmol/L (ref 98–111)
Creatinine, Ser: 0.93 mg/dL (ref 0.44–1.00)
GFR calc Af Amer: >60 mL/min (ref >60)
GFR calc non Af Amer: >60 mL/min (ref >60)
Glucose, Bld: 100 mg/dL — ABNORMAL HIGH (ref 70–99)
Potassium: 3.8 mmol/L (ref 3.5–5.1)
Sodium: 139 mmol/L (ref 135–145)

## 2019-08-16 LAB — CBC
HCT: 40.3 % (ref 36.0–46.0)
Hemoglobin: 14 g/dL (ref 12.0–15.0)
MCH: 32.3 pg (ref 26.0–34.0)
MCHC: 34.7 g/dL (ref 30.0–36.0)
MCV: 93.1 fL (ref 80.0–100.0)
Platelets: 347 10*3/uL (ref 150–400)
RBC: 4.33 MIL/uL (ref 3.87–5.11)
RDW: 13.4 % (ref 11.5–15.5)
WBC: 10 10*3/uL (ref 4.0–10.5)
nRBC: 0 % (ref 0.0–0.2)

## 2019-08-16 LAB — TYPE AND SCREEN
ABO/RH(D): B POS
Antibody Screen: NEGATIVE

## 2019-08-16 LAB — SURGICAL PCR SCREEN
MRSA, PCR: NEGATIVE
Staphylococcus aureus: POSITIVE — AB

## 2019-08-16 LAB — ABO/RH: ABO/RH(D): B POS

## 2019-08-16 NOTE — Progress Notes (Signed)
PCP - None Cardiologist - None  PPM/ICD - Denies Device Orders - N/A Rep Notified N/A  Chest x-ray - N/A EKG - 08/16/2019 Stress Test - Denies ECHO - Denies Cardiac Cath - Denies  Sleep Study -  Denies CPAP - Denises  Fasting Blood Sugar - N/A Checks Blood Sugar __N/A___ times a day  Blood Thinner Instructions: N/A Aspirin Instructions: N/A  ERAS Protcol - Yes PRE-SURGERY Ensure - Yes  COVID TEST- 08/16/2019   Anesthesia review: No  Patient denies shortness of breath, fever, cough and chest pain at PAT appointment   Coronavirus Screening  Have you experienced the following symptoms:  Cough yes/no: No Fever (>100.31F)  yes/no: No Runny nose yes/no: No Sore throat yes/no: No Difficulty breathing/shortness of breath  yes/no: No  Have you or a family member traveled in the last 14 days and where? yes/no: No   If the patient indicates "YES" to the above questions, their PAT will be rescheduled to limit the exposure to others and, the surgeon will be notified. THE PATIENT WILL NEED TO BE ASYMPTOMATIC FOR 14 DAYS.   If the patient is not experiencing any of these symptoms, the PAT nurse will instruct them to NOT bring anyone with them to their appointment since they may have these symptoms or traveled as well.   Please remind your patients and families that hospital visitation restrictions are in effect and the importance of the restrictions.     Patient verbalized understanding of instructions that were given to them at the PAT appointment. Patient was also instructed that they will need to review over the PAT instructions again at home before surgery.

## 2019-08-18 LAB — NOVEL CORONAVIRUS, NAA (HOSP ORDER, SEND-OUT TO REF LAB; TAT 18-24 HRS): SARS-CoV-2, NAA: NOT DETECTED

## 2019-08-19 MED ORDER — TRANEXAMIC ACID-NACL 1000-0.7 MG/100ML-% IV SOLN
1000.0000 mg | INTRAVENOUS | Status: AC
  Administered 2019-08-20: 13:00:00 1000 mg via INTRAVENOUS
  Filled 2019-08-19: qty 100

## 2019-08-20 ENCOUNTER — Inpatient Hospital Stay (HOSPITAL_COMMUNITY): Payer: PRIVATE HEALTH INSURANCE

## 2019-08-20 ENCOUNTER — Other Ambulatory Visit: Payer: Self-pay

## 2019-08-20 ENCOUNTER — Inpatient Hospital Stay (HOSPITAL_COMMUNITY)
Admission: RE | Admit: 2019-08-20 | Discharge: 2019-08-22 | DRG: 468 | Disposition: A | Discharge status: Home / Self Care | Payer: PRIVATE HEALTH INSURANCE | Source: Ambulatory Visit | Attending: Orthopaedic Surgery | Admitting: Orthopaedic Surgery

## 2019-08-20 ENCOUNTER — Encounter (HOSPITAL_COMMUNITY): Payer: Self-pay

## 2019-08-20 ENCOUNTER — Encounter (HOSPITAL_COMMUNITY)
Admission: RE | Disposition: A | Discharge status: Home / Self Care | Payer: Self-pay | Source: Ambulatory Visit | Attending: Orthopaedic Surgery

## 2019-08-20 ENCOUNTER — Ambulatory Visit (HOSPITAL_COMMUNITY): Payer: PRIVATE HEALTH INSURANCE | Admitting: Certified Registered Nurse Anesthetist

## 2019-08-20 ENCOUNTER — Ambulatory Visit (HOSPITAL_COMMUNITY): Payer: PRIVATE HEALTH INSURANCE

## 2019-08-20 DIAGNOSIS — M25551 Pain in right hip: Secondary | ICD-10-CM | POA: Diagnosis present

## 2019-08-20 DIAGNOSIS — T84030D Mechanical loosening of internal right hip prosthetic joint, subsequent encounter: Secondary | ICD-10-CM

## 2019-08-20 DIAGNOSIS — Z419 Encounter for procedure for purposes other than remedying health state, unspecified: Secondary | ICD-10-CM

## 2019-08-20 DIAGNOSIS — T84030A Mechanical loosening of internal right hip prosthetic joint, initial encounter: Principal | ICD-10-CM | POA: Diagnosis present

## 2019-08-20 DIAGNOSIS — I10 Essential (primary) hypertension: Secondary | ICD-10-CM | POA: Diagnosis present

## 2019-08-20 DIAGNOSIS — M549 Dorsalgia, unspecified: Secondary | ICD-10-CM | POA: Diagnosis present

## 2019-08-20 DIAGNOSIS — F17210 Nicotine dependence, cigarettes, uncomplicated: Secondary | ICD-10-CM | POA: Diagnosis present

## 2019-08-20 DIAGNOSIS — Y792 Prosthetic and other implants, materials and accessory orthopedic devices associated with adverse incidents: Secondary | ICD-10-CM | POA: Diagnosis present

## 2019-08-20 DIAGNOSIS — G8929 Other chronic pain: Secondary | ICD-10-CM | POA: Diagnosis present

## 2019-08-20 DIAGNOSIS — Z96649 Presence of unspecified artificial hip joint: Secondary | ICD-10-CM

## 2019-08-20 DIAGNOSIS — Z96641 Presence of right artificial hip joint: Secondary | ICD-10-CM | POA: Diagnosis present

## 2019-08-20 DIAGNOSIS — J45909 Unspecified asthma, uncomplicated: Secondary | ICD-10-CM | POA: Diagnosis present

## 2019-08-20 DIAGNOSIS — M7061 Trochanteric bursitis, right hip: Secondary | ICD-10-CM

## 2019-08-20 DIAGNOSIS — M87051 Idiopathic aseptic necrosis of right femur: Secondary | ICD-10-CM

## 2019-08-20 HISTORY — PX: ANTERIOR HIP REVISION: SHX6527

## 2019-08-20 LAB — POCT PREGNANCY, URINE: Preg Test, Ur: NEGATIVE

## 2019-08-20 SURGERY — REVISION, TOTAL ARTHROPLASTY, HIP, ANTERIOR APPROACH
Anesthesia: Monitor Anesthesia Care | Site: Hip | Laterality: Right

## 2019-08-20 MED ORDER — METOCLOPRAMIDE HCL 5 MG PO TABS: 5.0000 mg | ORAL_TABLET | Freq: Three times a day (TID) | ORAL | Status: DC | PRN

## 2019-08-20 MED ORDER — OXYCODONE HCL 5 MG PO TABS
10.0000 mg | ORAL_TABLET | ORAL | Status: DC | PRN
  Administered 2019-08-20 – 2019-08-22 (×5): 15 mg via ORAL
  Filled 2019-08-20 (×2): qty 2
  Filled 2019-08-20 (×3): qty 3

## 2019-08-20 MED ORDER — ASPIRIN 81 MG PO CHEW
81.0000 mg | CHEWABLE_TABLET | Freq: Two times a day (BID) | ORAL | Status: DC
  Administered 2019-08-20 – 2019-08-22 (×4): 81 mg via ORAL
  Filled 2019-08-20 (×4): qty 1

## 2019-08-20 MED ORDER — ONDANSETRON HCL 4 MG/2ML IJ SOLN: 4.0000 mg | Freq: Once | INTRAMUSCULAR | Status: DC | PRN

## 2019-08-20 MED ORDER — DEXAMETHASONE SODIUM PHOSPHATE 4 MG/ML IJ SOLN
INTRAMUSCULAR | Status: DC | PRN
  Administered 2019-08-20: 5 mg via INTRAVENOUS

## 2019-08-20 MED ORDER — PROPOFOL 500 MG/50ML IV EMUL
INTRAVENOUS | Status: DC | PRN
  Administered 2019-08-20: 14:00:00 via INTRAVENOUS
  Administered 2019-08-20: 125 ug/kg/min via INTRAVENOUS

## 2019-08-20 MED ORDER — METHOCARBAMOL 500 MG PO TABS
500.0000 mg | ORAL_TABLET | Freq: Four times a day (QID) | ORAL | Status: DC | PRN
  Administered 2019-08-20 – 2019-08-21 (×3): 500 mg via ORAL
  Filled 2019-08-20 (×2): qty 1

## 2019-08-20 MED ORDER — HYDROMORPHONE HCL 1 MG/ML IJ SOLN
0.5000 mg | INTRAMUSCULAR | Status: DC | PRN
  Administered 2019-08-20 – 2019-08-21 (×6): 1 mg via INTRAVENOUS
  Filled 2019-08-20 (×6): qty 1

## 2019-08-20 MED ORDER — DIPHENHYDRAMINE HCL 12.5 MG/5ML PO ELIX: 12.5000 mg | ORAL_SOLUTION | ORAL | Status: DC | PRN

## 2019-08-20 MED ORDER — PHENYLEPHRINE HCL (PRESSORS) 10 MG/ML IV SOLN
INTRAVENOUS | Status: DC | PRN
  Administered 2019-08-20 (×2): 80 ug via INTRAVENOUS

## 2019-08-20 MED ORDER — METHOCARBAMOL 500 MG PO TABS
ORAL_TABLET | ORAL | Status: AC
  Filled 2019-08-20: qty 1

## 2019-08-20 MED ORDER — CEFAZOLIN SODIUM-DEXTROSE 1-4 GM/50ML-% IV SOLN
1.0000 g | Freq: Four times a day (QID) | INTRAVENOUS | Status: AC
  Administered 2019-08-20 (×2): 1 g via INTRAVENOUS
  Filled 2019-08-20 (×2): qty 50

## 2019-08-20 MED ORDER — CEFAZOLIN SODIUM-DEXTROSE 2-3 GM-%(50ML) IV SOLR
INTRAVENOUS | Status: DC | PRN
  Administered 2019-08-20: 2 g via INTRAVENOUS

## 2019-08-20 MED ORDER — PHENOL 1.4 % MT LIQD: 1.0000 | OROMUCOSAL | Status: DC | PRN

## 2019-08-20 MED ORDER — VITAMIN C 500 MG PO TABS
500.0000 mg | ORAL_TABLET | Freq: Every day | ORAL | Status: DC
  Administered 2019-08-21 – 2019-08-22 (×2): 500 mg via ORAL
  Filled 2019-08-20 (×2): qty 1

## 2019-08-20 MED ORDER — ALUM & MAG HYDROXIDE-SIMETH 200-200-20 MG/5ML PO SUSP: 30.0000 mL | ORAL | Status: DC | PRN

## 2019-08-20 MED ORDER — ONDANSETRON HCL 4 MG PO TABS: 4.0000 mg | ORAL_TABLET | Freq: Four times a day (QID) | ORAL | Status: DC | PRN

## 2019-08-20 MED ORDER — LISINOPRIL 20 MG PO TABS
20.0000 mg | ORAL_TABLET | Freq: Every day | ORAL | Status: DC
  Administered 2019-08-21 – 2019-08-22 (×2): 20 mg via ORAL
  Filled 2019-08-20 (×2): qty 1

## 2019-08-20 MED ORDER — SODIUM CHLORIDE 0.9 % IV SOLN
INTRAVENOUS | Status: DC
  Administered 2019-08-20 – 2019-08-21 (×2): via INTRAVENOUS

## 2019-08-20 MED ORDER — ONDANSETRON HCL 4 MG/2ML IJ SOLN
INTRAMUSCULAR | Status: AC
  Filled 2019-08-20: qty 2

## 2019-08-20 MED ORDER — PROPOFOL 10 MG/ML IV BOLUS
INTRAVENOUS | Status: DC | PRN
  Administered 2019-08-20: 40 mg via INTRAVENOUS
  Administered 2019-08-20: 20 mg via INTRAVENOUS

## 2019-08-20 MED ORDER — PANTOPRAZOLE SODIUM 40 MG PO TBEC
40.0000 mg | DELAYED_RELEASE_TABLET | Freq: Every day | ORAL | Status: DC
  Administered 2019-08-20 – 2019-08-22 (×3): 40 mg via ORAL
  Filled 2019-08-20 (×3): qty 1

## 2019-08-20 MED ORDER — OXYCODONE HCL 5 MG PO TABS: 5.0000 mg | ORAL_TABLET | Freq: Once | ORAL | Status: DC | PRN

## 2019-08-20 MED ORDER — SODIUM CHLORIDE 0.9 % IR SOLN
Status: DC | PRN
  Administered 2019-08-20: 3000 mL

## 2019-08-20 MED ORDER — FENTANYL CITRATE (PF) 250 MCG/5ML IJ SOLN
INTRAMUSCULAR | Status: AC
  Filled 2019-08-20: qty 5

## 2019-08-20 MED ORDER — SODIUM CHLORIDE 0.9 % IV SOLN
INTRAVENOUS | Status: DC | PRN
  Administered 2019-08-20: 13:00:00 40 ug/min via INTRAVENOUS

## 2019-08-20 MED ORDER — ALBUTEROL SULFATE (2.5 MG/3ML) 0.083% IN NEBU: 3.0000 mL | INHALATION_SOLUTION | Freq: Four times a day (QID) | RESPIRATORY_TRACT | Status: DC | PRN

## 2019-08-20 MED ORDER — OXYCODONE HCL 5 MG PO TABS
5.0000 mg | ORAL_TABLET | ORAL | Status: DC | PRN
  Administered 2019-08-21: 12:00:00 10 mg via ORAL
  Filled 2019-08-20: qty 1

## 2019-08-20 MED ORDER — 0.9 % SODIUM CHLORIDE (POUR BTL) OPTIME
TOPICAL | Status: DC | PRN
  Administered 2019-08-20: 1000 mL

## 2019-08-20 MED ORDER — ONDANSETRON HCL 4 MG/2ML IJ SOLN
INTRAMUSCULAR | Status: DC | PRN
  Administered 2019-08-20: 4 mg via INTRAVENOUS

## 2019-08-20 MED ORDER — OXYCODONE HCL 5 MG/5ML PO SOLN: 5.0000 mg | Freq: Once | ORAL | Status: DC | PRN

## 2019-08-20 MED ORDER — POLYETHYLENE GLYCOL 3350 17 G PO PACK: 17.0000 g | PACK | Freq: Every day | ORAL | Status: DC | PRN

## 2019-08-20 MED ORDER — POVIDONE-IODINE 10 % EX SWAB: 2.0000 "application " | Freq: Once | CUTANEOUS | Status: DC

## 2019-08-20 MED ORDER — ONDANSETRON HCL 4 MG/2ML IJ SOLN: 4.0000 mg | Freq: Four times a day (QID) | INTRAMUSCULAR | Status: DC | PRN

## 2019-08-20 MED ORDER — HYDROCHLOROTHIAZIDE 12.5 MG PO CAPS
12.5000 mg | ORAL_CAPSULE | Freq: Every day | ORAL | Status: DC
  Administered 2019-08-21 – 2019-08-22 (×2): 12.5 mg via ORAL
  Filled 2019-08-20 (×2): qty 1

## 2019-08-20 MED ORDER — FENTANYL CITRATE (PF) 100 MCG/2ML IJ SOLN
25.0000 ug | INTRAMUSCULAR | Status: DC | PRN
  Administered 2019-08-20 (×2): 50 ug via INTRAVENOUS

## 2019-08-20 MED ORDER — LISINOPRIL-HYDROCHLOROTHIAZIDE 20-12.5 MG PO TABS: 1.0000 | ORAL_TABLET | Freq: Every day | ORAL | Status: DC

## 2019-08-20 MED ORDER — MENTHOL 3 MG MT LOZG: 1.0000 | LOZENGE | OROMUCOSAL | Status: DC | PRN

## 2019-08-20 MED ORDER — ACETAMINOPHEN 325 MG PO TABS: 325.0000 mg | ORAL_TABLET | Freq: Four times a day (QID) | ORAL | Status: DC | PRN

## 2019-08-20 MED ORDER — DOCUSATE SODIUM 100 MG PO CAPS
100.0000 mg | ORAL_CAPSULE | Freq: Two times a day (BID) | ORAL | Status: DC
  Administered 2019-08-20 – 2019-08-22 (×4): 100 mg via ORAL
  Filled 2019-08-20 (×4): qty 1

## 2019-08-20 MED ORDER — PROPOFOL 10 MG/ML IV BOLUS
INTRAVENOUS | Status: AC
  Filled 2019-08-20: qty 20

## 2019-08-20 MED ORDER — CHLORHEXIDINE GLUCONATE 4 % EX LIQD: 60.0000 mL | Freq: Once | CUTANEOUS | Status: DC

## 2019-08-20 MED ORDER — MIDAZOLAM HCL 2 MG/2ML IJ SOLN
INTRAMUSCULAR | Status: AC
  Filled 2019-08-20: qty 2

## 2019-08-20 MED ORDER — FENTANYL CITRATE (PF) 100 MCG/2ML IJ SOLN
INTRAMUSCULAR | Status: AC
  Filled 2019-08-20: qty 2

## 2019-08-20 MED ORDER — MIDAZOLAM HCL 2 MG/2ML IJ SOLN
INTRAMUSCULAR | Status: DC | PRN
  Administered 2019-08-20: 2 mg via INTRAVENOUS

## 2019-08-20 MED ORDER — METHOCARBAMOL 1000 MG/10ML IJ SOLN
500.0000 mg | Freq: Four times a day (QID) | INTRAVENOUS | Status: DC | PRN
  Filled 2019-08-20: qty 5

## 2019-08-20 MED ORDER — ZOLPIDEM TARTRATE 5 MG PO TABS
5.0000 mg | ORAL_TABLET | Freq: Every evening | ORAL | Status: DC | PRN
  Administered 2019-08-20 – 2019-08-21 (×2): 5 mg via ORAL
  Filled 2019-08-20 (×2): qty 1

## 2019-08-20 MED ORDER — METOCLOPRAMIDE HCL 5 MG/ML IJ SOLN: 5.0000 mg | Freq: Three times a day (TID) | INTRAMUSCULAR | Status: DC | PRN

## 2019-08-20 MED ORDER — OXYCODONE HCL 5 MG PO TABS
ORAL_TABLET | ORAL | Status: AC
  Filled 2019-08-20: qty 3

## 2019-08-20 MED ORDER — LACTATED RINGERS IV SOLN
INTRAVENOUS | Status: DC | PRN
  Administered 2019-08-20 (×2): via INTRAVENOUS

## 2019-08-20 MED ORDER — EPHEDRINE SULFATE 50 MG/ML IJ SOLN
INTRAMUSCULAR | Status: DC | PRN
  Administered 2019-08-20: 10 mg via INTRAVENOUS

## 2019-08-20 MED ORDER — FENTANYL CITRATE (PF) 100 MCG/2ML IJ SOLN
INTRAMUSCULAR | Status: DC | PRN
  Administered 2019-08-20 (×2): 50 ug via INTRAVENOUS
  Administered 2019-08-20: 25 ug via INTRAVENOUS
  Administered 2019-08-20: 50 ug via INTRAVENOUS

## 2019-08-20 MED ORDER — LIDOCAINE 2% (20 MG/ML) 5 ML SYRINGE
INTRAMUSCULAR | Status: AC
  Filled 2019-08-20: qty 5

## 2019-08-20 SURGICAL SUPPLY — 52 items
BENZOIN TINCTURE PRP APPL 2/3 (GAUZE/BANDAGES/DRESSINGS) ×3 IMPLANT
BLADE SAW SGTL 18X1.27X75 (BLADE) ×2 IMPLANT
BLADE SAW SGTL 18X1.27X75MM (BLADE) ×1
CLOSURE WOUND 1/2 X4 (GAUZE/BANDAGES/DRESSINGS) ×2
COVER SURGICAL LIGHT HANDLE (MISCELLANEOUS) ×3 IMPLANT
COVER WAND RF STERILE (DRAPES) ×3 IMPLANT
DRAPE C-ARM 42X72 X-RAY (DRAPES) ×3 IMPLANT
DRAPE STERI IOBAN 125X83 (DRAPES) ×3 IMPLANT
DRAPE U-SHAPE 47X51 STRL (DRAPES) ×9 IMPLANT
DRSG AQUACEL AG ADV 3.5X10 (GAUZE/BANDAGES/DRESSINGS) ×3 IMPLANT
DRSG XEROFORM 1X8 (GAUZE/BANDAGES/DRESSINGS) ×2 IMPLANT
DURAPREP 26ML APPLICATOR (WOUND CARE) ×3 IMPLANT
ELECT BLADE 4.0 EZ CLEAN MEGAD (MISCELLANEOUS) ×3
ELECT BLADE 6.5 EXT (BLADE) IMPLANT
ELECT REM PT RETURN 9FT ADLT (ELECTROSURGICAL) ×3
ELECTRODE BLDE 4.0 EZ CLN MEGD (MISCELLANEOUS) ×1 IMPLANT
ELECTRODE REM PT RTRN 9FT ADLT (ELECTROSURGICAL) ×1 IMPLANT
FACESHIELD WRAPAROUND (MASK) ×9 IMPLANT
FACESHIELD WRAPAROUND OR TEAM (MASK) ×2 IMPLANT
GLOVE BIOGEL PI IND STRL 8 (GLOVE) ×2 IMPLANT
GLOVE BIOGEL PI INDICATOR 8 (GLOVE) ×4
GLOVE ECLIPSE 8.0 STRL XLNG CF (GLOVE) ×3 IMPLANT
GLOVE ORTHO TXT STRL SZ7.5 (GLOVE) ×6 IMPLANT
GOWN STRL REUS W/ TWL LRG LVL3 (GOWN DISPOSABLE) ×2 IMPLANT
GOWN STRL REUS W/ TWL XL LVL3 (GOWN DISPOSABLE) ×2 IMPLANT
GOWN STRL REUS W/TWL LRG LVL3 (GOWN DISPOSABLE) ×4
GOWN STRL REUS W/TWL XL LVL3 (GOWN DISPOSABLE) ×4
HANDPIECE INTERPULSE COAX TIP (DISPOSABLE) ×2
HEAD FEM STD 32X+5 STRL (Hips) ×2 IMPLANT
KIT BASIN OR (CUSTOM PROCEDURE TRAY) ×3 IMPLANT
KIT TURNOVER KIT B (KITS) ×3 IMPLANT
MANIFOLD NEPTUNE II (INSTRUMENTS) ×3 IMPLANT
NS IRRIG 1000ML POUR BTL (IV SOLUTION) ×3 IMPLANT
PACK TOTAL JOINT (CUSTOM PROCEDURE TRAY) ×3 IMPLANT
PAD ARMBOARD 7.5X6 YLW CONV (MISCELLANEOUS) ×3 IMPLANT
SET HNDPC FAN SPRY TIP SCT (DISPOSABLE) ×1 IMPLANT
STAPLER VISISTAT 35W (STAPLE) IMPLANT
STEM AML 10.5 STD 6 LRG (Stem) ×2 IMPLANT
STRIP CLOSURE SKIN 1/2X4 (GAUZE/BANDAGES/DRESSINGS) ×4 IMPLANT
SUT ETHIBOND NAB CT1 #1 30IN (SUTURE) ×3 IMPLANT
SUT MNCRL AB 4-0 PS2 18 (SUTURE) IMPLANT
SUT VIC AB 0 CT1 27 (SUTURE) ×2
SUT VIC AB 0 CT1 27XBRD ANBCTR (SUTURE) ×1 IMPLANT
SUT VIC AB 1 CT1 27 (SUTURE) ×2
SUT VIC AB 1 CT1 27XBRD ANBCTR (SUTURE) ×1 IMPLANT
SUT VIC AB 2-0 CT1 27 (SUTURE) ×2
SUT VIC AB 2-0 CT1 TAPERPNT 27 (SUTURE) ×1 IMPLANT
TOWEL GREEN STERILE (TOWEL DISPOSABLE) ×3 IMPLANT
TOWEL GREEN STERILE FF (TOWEL DISPOSABLE) ×3 IMPLANT
TRAY FOLEY W/BAG SLVR 16FR (SET/KITS/TRAYS/PACK) ×2
TRAY FOLEY W/BAG SLVR 16FR ST (SET/KITS/TRAYS/PACK) IMPLANT
WATER STERILE IRR 1000ML POUR (IV SOLUTION) ×6 IMPLANT

## 2019-08-20 NOTE — Transfer of Care (Signed)
Immediate Anesthesia Transfer of Care Note  Patient: Mariah Bell  Procedure(s) Performed: RIGHT TOTAL HIP REVISION (Right Hip)  Patient Location: PACU  Anesthesia Type:MAC and Spinal  Level of Consciousness: drowsy  Airway & Oxygen Therapy: Patient Spontanous Breathing  Post-op Assessment: Report given to RN and Post -op Vital signs reviewed and stable  Post vital signs: Reviewed and stable  Last Vitals:  Vitals Value Taken Time  BP 104/86 08/20/19 1441  Temp    Pulse 72 08/20/19 1444  Resp 17 08/20/19 1444  SpO2 99 % 08/20/19 1444  Vitals shown include unvalidated device data.  Last Pain:  Vitals:   08/20/19 1018  TempSrc: Oral  PainSc: 0-No pain      Patients Stated Pain Goal: 5 (97/94/80 1655)  Complications: No apparent anesthesia complications

## 2019-08-20 NOTE — Anesthesia Postprocedure Evaluation (Signed)
Anesthesia Post Note  Patient: Mariah Bell  Procedure(s) Performed: RIGHT TOTAL HIP REVISION (Right Hip)     Patient location during evaluation: PACU Anesthesia Type: MAC Level of consciousness: oriented and awake and alert Pain management: pain level controlled Vital Signs Assessment: post-procedure vital signs reviewed and stable Respiratory status: spontaneous breathing, respiratory function stable and patient connected to nasal cannula oxygen Cardiovascular status: blood pressure returned to baseline and stable Postop Assessment: no headache, no backache and no apparent nausea or vomiting Anesthetic complications: no    Last Vitals:  Vitals:   08/20/19 1650 08/20/19 1651  BP: 138/88   Pulse: (!) 51 (!) 55  Resp: 16   Temp: (!) 36.3 C   SpO2: 96%     Last Pain:  Vitals:   08/20/19 1650  TempSrc: Oral  PainSc:                  Takari Duncombe COKER

## 2019-08-20 NOTE — Op Note (Signed)
NAMGreer [facility]kerel: Bell, [name] MEDICAL RECORD [id number] ACCOUNT [id number] DATE OF BIRTH:03/17/1976 FACILITY: [facility] LOCATION: [facility] PHYSICIAN:Lorik Guo Aretha ParrotY. 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  IMPLANTS:  AML large stature size 10.5 femoral component, 32+5 metal hip ball.  SURGEON:  Vanita PandaChristopher Y. Magnus IvanBlackman, MD  ASSISTANT:  Richardean CanalGilbert Clark, PA-C  ANESTHESIA:  Spinal.  ANTIBIOTICS:  Two grams IV Ancef.  ESTIMATED BLOOD LOSS:  200 mL.  COMPLICATIONS:  None.  INDICATIONS:  The patient is a 43 year old female who in November 2016 underwent a primary right total hip arthroplasty through a direct anterior approach.  By 2018, she was having hip pain and x-rays were obtained.  It was concerning for prosthetic  loosening based on plain films.  She was continuing to have pain, and in 2018 I recommended a revision arthroplasty.  She did not end up coming back as she did not feel like she wanted to proceed with surgery.  Now in 2020, she was developing hip pain  again and actually presented to the ER with more of low back pain after straining her back.  They did obtain x-rays of her hip, and it still was consistent with loosening of her femoral prosthesis with bone ingrown distally, but more of a windshield  wiper effect on the proximal component.  Given her continued pain that seemed to be only mechanical in nature, I have recommended revision arthroplasty of the femoral component with intraoperative assessment of the remainder of the components on the  acetabular side and revising those as needed.  The risks and benefits of surgery were explained to her in significant detail.   We talked about the risk of infection, acute blood loss anemia, nerve and vessel injury, DVT, fracture, dislocation, and  implant failure.  We talked about the goals being decreased pain, hopefully improve mobility and improve quality of life.  DESCRIPTION OF PROCEDURE:  After informed consent was obtained and appropriate right hip was marked, she was brought to the operating room and sat up on the stretcher.  Spinal anesthesia was then obtained.  She was then laid in supine position on a  stretcher.  A Foley catheter was placed, and with her lying supine, I was able to get a good assessment of her leg lengths.  She is actually shorter on that right side, so certainly there has been some subsidence, but she is certainly shorter when I will  have her in a supine position.  Traction boots were placed on both her feet.  Next, I placed her supine on the Hana fracture table, the perineal post in place, and both legs in in-line skeletal traction, but no traction applied.  I then assessed her  again radiographically before starting with the C-arm.  I compared this to the preoperative films that were standing in the office.  Both my intraoperative films and the C-arm films showed that her leg lengths were equal, but absolutely knowing that her  leg lengths were shorter on that side and even need to make her longer, we then prepped the right hip with DuraPrep and sterile drapes.  A timeout was called, and she was identified as correct patient, correct right hip.  I then was able to go actually  through her  same anterior hip incision that I used prior and dissected down to the tensor fascia lata muscle.  The tensor fascia was then divided longitudinally, and I could proceed with a direct anterior approach to the hip.  I then removed as much scar  tissue.  I could get down to the joint.  I was actually able to find my joint capsule repair and identified the Ethibond sutures.  I was able to open up the previous joint  capsule and actually removed the lateral joint capsule just due to scar tissue.   I then placed Cobra retractors around the medial and lateral femoral neck of the component, and I was able to pull some traction, and with external rotation, we were able to dislocate the hip.  I then removed the trial ball.  We then brought the leg down  and underneath the other leg with the leg rotated to 130 degrees.  I was able to remove the hip ball easily.  I then brought the leg back over and up, and I assessed the acetabular component and found it to be stable.  I did not see any significant wear  of the poly liner, so we decided to leave that in place.  We then brought the leg back down and under again with the leg externally rotated to 130 degrees.  Then I was able to remove some bone around the collar that had grown up over the collar, but  once I did that, it was easy to see the stem was significantly loose.  Using a distraction device, I was able to actually easily remove the entire component without disrupting the bone.  You could see that there was no hydroxyapatite left on the stem.   Through that approach to the leg down and under, I then started passing different broaches, using first the ACTIS broaching system just to try to clean up as much of the previous hydroxyapatite coating as I could.  Then I passed a drill, and I was able  to finally get past the pedicle distally where the bone had grown around the tip of the stem.  I then passed several reamers.  At that point, I decided to go with an AML system.  I felt comfortable with the reaming.  I was able to ream up to a size 10.5  mm reamer.  I then placed the 10.5 large-stature broach from AML and placed a standard neck and a 32+1 hip ball.  I brought the leg back over and up with traction, internal rotation, reducing the pelvis, and we felt like we needed just a little bit more  leg length.  We were pleased radiographically with the fit of the stem and the  canal filling of the broach.  We then dislocated the hip and removed the trial components.  I placed the real size 10.5 AML large-stature femoral component from [facility] and a  32+5 metal hip ball.  We reduced this in the acetabulum, and we were pleased with the stability.  We then irrigated the soft tissue with normal saline solution using pulsatile lavage.  Of note, prior to placing the final AML component, we did irrigate  the canal as well.  Once we were able to close the joint capsule with interrupted #1 Ethibond suture, we closed the tensor fascia with #1 Vicryl, followed by 0 Vicryl to close the deep tissue, 2-0 Vicryl to close the subcutaneous tissue, and interrupted  staples to close the skin incision.  A Xeroform  and well-padded Aquacel dressing were applied.  She was taken off the Hana table and taken to recovery room in stable condition.  All final counts were correct.  There were no complications noted.  Of note,  Benita Stabile, PA-C, assisted the entire case.  His assistance was crucial for facilitating all aspects of this case.  LN/NUANCE  D:08/20/2019 T:08/20/2019 JOB:[id number]

## 2019-08-20 NOTE — Evaluation (Signed)
Physical Therapy Evaluation Patient Details Name: Mariah Bell MRN: [id number] DOB: Feb 15, 1976 Today's Date: 08/20/2019   History of Present Illness  Pt is a 43 y/o female s/p R total hip revision, direct anterior. PMH includes R THA, asthma, and HTN.   Clinical Impression  Pt is s/p surgery above with deficits below. Pt requiring mod A for bed mobility and min to min guard A for transfer to chair using RW. Further mobility limited by pain. Anticipate pt will progress well once pain controlled. Will continue to follow acutely to maximize functional mobility independence and safety.     Follow Up Recommendations Follow surgeon's recommendation for DC plan and follow-up therapies    Equipment Recommendations  Rolling walker with 5" wheels;3in1 (PT)    Recommendations for Other Services       Precautions / Restrictions Precautions Precautions: None Restrictions Weight Bearing Restrictions: Yes RLE Weight Bearing: Weight bearing as tolerated      Mobility  Bed Mobility Overal bed mobility: Needs Assistance Bed Mobility: Supine to Sit     Supine to sit: Mod assist     General bed mobility comments: Mod A for RLE assist and to assist with scooting hips to EOB. Pt reports hips felt like they were "stuck" to the bed.   Transfers Overall transfer level: Needs assistance Equipment used: Rolling walker (2 wheeled) Transfers: Sit to/from [facility] Sit to Stand: Min assist Stand pivot transfers: Min guard       General transfer comment: Min A for lift assist and steadying to stand. Cues for safe hand placement. Min guard for safety for transfer to chair using RW. Pt with increased pain, so further mobility deferred.   Ambulation/Gait                Stairs            Wheelchair Mobility    Modified Rankin (Stroke Patients Only)       Balance Overall balance assessment: Needs assistance Sitting-balance support: No upper extremity  supported;Feet supported Sitting balance-Leahy Scale: Fair     Standing balance support: Bilateral upper extremity supported;During functional activity Standing balance-Leahy Scale: Poor Standing balance comment: Reliant on BUE support                              Pertinent Vitals/Pain Pain Assessment: 0-10 Pain Score: 9  Pain Location: R hip  Pain Descriptors / Indicators: Aching;Operative site guarding Pain Intervention(s): Limited activity within patient's tolerance;Monitored during session;Repositioned    Home Living Family/patient expects to be discharged to:: Private residence Living Arrangements: Alone Available Help at Discharge: Family;Available 24 hours/day Type of Home: House Home Access: Stairs to enter Entrance Stairs-Rails: Right Entrance Stairs-Number of Steps: 2 Home Layout: One level Home Equipment: None      Prior Function Level of Independence: Independent               Hand Dominance        Extremity/Trunk Assessment   Upper Extremity Assessment Upper Extremity Assessment: Defer to OT evaluation    Lower Extremity Assessment Lower Extremity Assessment: RLE deficits/detail RLE Deficits / Details: Deficits consistent with post op pain and weakness.     Cervical / Trunk Assessment Cervical / Trunk Assessment: Normal  Communication   Communication: No difficulties  Cognition Arousal/Alertness: Suspect due to medications Behavior During Therapy: WFL for tasks assessed/performed Overall Cognitive Status: Within Functional Limits for tasks assessed  General Comments: Pt with initial lethargy, however, improved with participation in mobility tasks.       General Comments General comments (skin integrity, edema, etc.): Pt's son present at end of session     Exercises     Assessment/Plan    PT Assessment Patient needs continued PT services  PT Problem List Decreased  strength;Decreased balance;Decreased activity tolerance;Decreased mobility;Decreased range of motion;Decreased knowledge of use of DME;Decreased knowledge of precautions;Pain       PT Treatment Interventions DME instruction;Gait training;Functional mobility training;Stair training;Therapeutic activities;Therapeutic exercise;Balance training;Patient/family education    PT Goals (Current goals can be found in the Care Plan section)  Acute Rehab PT Goals Patient Stated Goal: to go home PT Goal Formulation: With patient Time For Goal Achievement: 09/03/19 Potential to Achieve Goals: Good    Frequency 7X/week   Barriers to discharge        Co-evaluation               AM-PAC PT "6 Clicks" Mobility  Outcome Measure Help needed turning from your back to your side while in a flat bed without using bedrails?: A Little Help needed moving from lying on your back to sitting on the side of a flat bed without using bedrails?: A Lot Help needed moving to and from a bed to a chair (including a wheelchair)?: A Little Help needed standing up from a chair using your arms (e.g., wheelchair or bedside chair)?: A Little Help needed to walk in hospital room?: A Lot Help needed climbing 3-5 steps with a railing? : A Lot 6 Click Score: 15    End of Session Equipment Utilized During Treatment: Gait belt Activity Tolerance: Patient limited by pain Patient left: in chair;with call bell/phone within reach;with family/visitor present Nurse Communication: Mobility status;Patient requests pain meds PT Visit Diagnosis: Other abnormalities of gait and mobility (R26.89);Pain Pain - Right/Left: Right Pain - part of body: Hip    Time: 5638-7564 PT Time Calculation (min) (ACUTE ONLY): 18 min   Charges:   PT Evaluation $PT Eval Low Complexity: 1 Low          Gladys Damme, PT, DPT  Acute Rehabilitation Services  Pager: [phone] Office: [phone]   Lehman Prom 08/20/2019, 6:21 PM

## 2019-08-20 NOTE — Anesthesia Procedure Notes (Signed)
Spinal  Patient location during procedure: OR Start time: 08/20/2019 12:05 PM End time: 08/20/2019 12:15 PM Staffing Anesthesiologist: Roberts Gaudy, MD Performed: anesthesiologist  Preanesthetic Checklist Completed: patient identified, site marked, surgical consent, pre-op evaluation, timeout performed, IV checked, risks and benefits discussed and monitors and equipment checked Spinal Block Patient position: sitting Prep: DuraPrep Patient monitoring: heart rate, cardiac monitor, continuous pulse ox and blood pressure Approach: midline Location: L3-4 Injection technique: single-shot Needle Needle type: Sprotte and Pencan  Needle gauge: 24 G Needle length: 9 cm Assessment Sensory level: T4 Additional Notes 1.6 cc 0.75% Bupivacaine injected easily

## 2019-08-20 NOTE — Brief Op Note (Signed)
08/20/2019  2:21 PM  PATIENT:  Mariah Bell  43 y.o. female  PRE-OPERATIVE DIAGNOSIS:  aseptic loosening right total hip  POST-OPERATIVE DIAGNOSIS:  aseptic loosening right total hip  PROCEDURE:  Procedure(s): RIGHT TOTAL HIP REVISION (Right)  SURGEON:  Surgeon(s) and Role:    Mcarthur Rossetti, MD - Primary  PHYSICIAN ASSISTANT: Benita Stabile, PA-C  ANESTHESIA:   spinal  EBL: 200 cc  COUNTS:  YES  DICTATION: .Other Dictation: Dictation Number [id number]  PLAN OF CARE: Admit to inpatient   PATIENT DISPOSITION:  PACU - hemodynamically stable.   Delay start of Pharmacological VTE agent (>24hrs) due to surgical blood loss or risk of bleeding: no

## 2019-08-20 NOTE — Anesthesia Preprocedure Evaluation (Addendum)
Anesthesia Evaluation  Patient identified by MRN, date of birth, ID band Patient awake    Reviewed: Allergy & Precautions, NPO status , Patient's Chart, lab work & pertinent test results  Airway Mallampati: II  TM Distance: >3 FB Neck ROM: Full    Dental  (+) Teeth Intact, Dental Advisory Given   Pulmonary Current Smoker and Patient abstained from smoking.,     + decreased breath sounds      Cardiovascular hypertension,  Rhythm:Regular Rate:Normal     Neuro/Psych    GI/Hepatic   Endo/Other    Renal/GU      Musculoskeletal   Abdominal   Peds  Hematology   Anesthesia Other Findings   Reproductive/Obstetrics                             Anesthesia Physical Anesthesia Plan  ASA: III  Anesthesia Plan: MAC and Spinal   Post-op Pain Management:    Induction: Intravenous  PONV Risk Score and Plan:   Airway Management Planned: Simple Face Mask and Natural Airway  Additional Equipment:   Intra-op Plan:   Post-operative Plan:   Informed Consent: I have reviewed the patients History and Physical, chart, labs and discussed the procedure including the risks, benefits and alternatives for the proposed anesthesia with the patient or authorized representative who has indicated his/her understanding and acceptance.     Dental advisory given  Plan Discussed with: CRNA and Anesthesiologist  Anesthesia Plan Comments:         Anesthesia Quick Evaluation

## 2019-08-20 NOTE — Care Management (Signed)
CM consult acknowledged to assist with any HH/DME needs. Awaiting PT/OT eval for DCP recommendations and will continue to follow.  Clarrisa Kaylor RN, BSN, NCM-BC, ACM-RN [phone] 

## 2019-08-20 NOTE — H&P (Signed)
TOTAL HIP REVISION ADMISSION H&P  Patient is admitted for right revision total hip arthroplasty.  Subjective:  Chief Complaint: right hip pain  HPI: Mariah Bell, 43 y.o. female, has a history of pain and functional disability in the right hip due to loosening of her right toal hip prosthesis and patient has failed non-surgical conservative treatments for greater than 12 weeks to include NSAID's and/or analgesics, flexibility and strengthening excercises, use of assistive devices, weight reduction as appropriate and activity modification. The indications for the revision total hip arthroplasty are loosening of one or more components.  Onset of symptoms was gradual starting 2 years ago with gradually worsening course since that time.  Prior procedures on the right hip include arthroplasty.  Patient currently rates pain in the right hip at 8 out of 10 with activity.  There is night pain, worsening of pain with activity and weight bearing, pain that interfers with activities of daily living and pain with passive range of motion. Patient has evidence of prosthetic loosening by imaging studies.  This condition presents safety issues increasing the risk of falls.  There is no current active infection.  Patient Active Problem List   Diagnosis Date Noted  . 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 Trochanteric bursitis, right hip 07/11/2017  . Avascular necrosis of bone of right hip (HCC) 10/09/2015  . Status post total replacement of right hip 10/09/2015   Past Medical History:  Diagnosis Date  . Arthritis   . Asthma   . Chronic back pain   . Chronic hip pain   . Hypertension     Past Surgical History:  Procedure Laterality Date  . TONSILLECTOMY    . TOTAL HIP ARTHROPLASTY Right 10/09/2015   Procedure: RIGHT TOTAL HIP ARTHROPLASTY ANTERIOR APPROACH;  Surgeon: Kathryne Hitch, MD;  Location: [facility];  Service: Orthopedics;  Laterality: Right;   . TUBAL LIGATION      Current Facility-Administered Medications  Medication Dose Route Frequency Provider Last Rate Last Dose  . chlorhexidine (HIBICLENS) 4 % liquid 4 application  60 mL Topical Once Richardean Canal W, PA-C      . povidone-iodine 10 % swab 2 application  2 application Topical Once Richardean Canal W, PA-C      . tranexamic acid (CYKLOKAPRON) IVPB 1,000 mg  1,000 mg Intravenous To OR Kathryne Hitch, MD       No Known Allergies  Social History   Tobacco Use  . Smoking status: Current Every Day Smoker    Packs/day: 2.00    Types: Cigarettes  . Smokeless tobacco: Never Used  Substance Use Topics  . Alcohol use: No    Family History  Problem Relation Age of Onset  . Diabetes Mother       Review of Systems  Musculoskeletal: Positive for joint pain.  All other systems reviewed and are negative.   Objective:  Physical Exam  Constitutional: She is oriented to person, place, and time. She appears well-developed and well-nourished.  HENT:  Head: Normocephalic and atraumatic.  Neck: Normal range of motion.  Cardiovascular: Normal rate.  Respiratory: Effort normal.  GI: Soft.  Musculoskeletal:     Right hip: She exhibits decreased strength, tenderness and bony tenderness.  Neurological: She is alert and oriented to person, place, and time.  Skin: Skin is warm and dry.  Psychiatric: She has a normal mood and affect.    Vital signs in last 24 hours: Temp:  [97.5 F (36.4 C)]  97.5 F (36.4 C) (10/06 1018) Pulse Rate:  [58] 58 (10/06 1018) Resp:  [19] 19 (10/06 1018) BP: (147)/(97) 147/97 (10/06 1018) SpO2:  [100 %] 100 % (10/06 1018) Weight:  [84.8 kg] 84.8 kg (10/06 1018)   Labs:   Estimated body mass index is 27.6 kg/m as calculated from the following:   Height as of this encounter: 5\' 9"  (1.753 m).   Weight as of this encounter: 84.8 kg.  Imaging Review:  Plain radiographs demonstrate loosening of the right hip femoral  component   Assessment/Plan:  Right hip pain, right hip(s) with failed previous arthroplasty.  The patient history, physical examination, clinical judgement of the provider and imaging studies are consistent with loosening of the right hip femoral component.  I spoke with the patient in length in detail about her revision arthroplasty surgery.  We discussed the risk of acute blood loss anemia, nerve vessel injury, fracture, infection, dislocation and implant failure.  She understands this will be quite a difficult surgery because the majority of the stem appears to be bone ingrown radiographically.  The proximal portion is concerning for loosening and that micromotion is likely what is causing her right hip pain.  We discussed nonoperative and operative treatment modalities.  She is tried nonoperative treatment for over a year now.  At this point we will proceed today with revision arthroplasty of the right hip.

## 2019-08-20 NOTE — Plan of Care (Signed)

## 2019-08-21 ENCOUNTER — Encounter (HOSPITAL_COMMUNITY): Payer: Self-pay | Admitting: Orthopaedic Surgery

## 2019-08-21 LAB — BASIC METABOLIC PANEL
Anion gap: 10 (ref 5–15)
BUN: <5 mg/dL — ABNORMAL LOW (ref 6–20)
CO2: 24 mmol/L (ref 22–32)
Calcium: 9 mg/dL (ref 8.9–10.3)
Chloride: 101 mmol/L (ref 98–111)
Creatinine, Ser: 0.95 mg/dL (ref 0.44–1.00)
GFR calc Af Amer: >60 mL/min (ref >60)
GFR calc non Af Amer: >60 mL/min (ref >60)
Glucose, Bld: 184 mg/dL — ABNORMAL HIGH (ref 70–99)
Potassium: 3.7 mmol/L (ref 3.5–5.1)
Sodium: 135 mmol/L (ref 135–145)

## 2019-08-21 LAB — CBC
HCT: 39.2 % (ref 36.0–46.0)
Hemoglobin: 13.1 g/dL (ref 12.0–15.0)
MCH: 31.3 pg (ref 26.0–34.0)
MCHC: 33.4 g/dL (ref 30.0–36.0)
MCV: 93.8 fL (ref 80.0–100.0)
Platelets: 365 10*3/uL (ref 150–400)
RBC: 4.18 MIL/uL (ref 3.87–5.11)
RDW: 13.3 % (ref 11.5–15.5)
WBC: 14.3 10*3/uL — ABNORMAL HIGH (ref 4.0–10.5)
nRBC: 0 % (ref 0.0–0.2)

## 2019-08-21 NOTE — Progress Notes (Signed)
Physical Therapy Progress Note  Clinical Impression: Pt continuing to make steady progress with mobility. Stair training was initiated this session (limited secondary to IV); however, able to ascend/descend two steps with railing and cueing for sequencing. Pt would continue to benefit from skilled physical therapy services at this time while admitted and after d/c to address the below listed limitations in order to improve overall safety and independence with functional mobility.    08/21/19 1200  PT Visit Information  Last PT Received On 08/21/19  Assistance Needed +1  History of Present Illness Pt is a 43 y/o female s/p R total hip revision, direct anterior. PMH includes R THA, asthma, and HTN.   Precautions  Precautions None  Restrictions  Weight Bearing Restrictions Yes  RLE Weight Bearing WBAT  Pain Assessment  Pain Assessment Faces  Faces Pain Scale 4  Pain Location R hip   Pain Descriptors / Indicators Aching;Operative site guarding  Pain Intervention(s) Monitored during session;Repositioned  Cognition  Arousal/Alertness Awake/alert  Behavior During Therapy WFL for tasks assessed/performed  Overall Cognitive Status Within Functional Limits for tasks assessed  Bed Mobility  Overal bed mobility Needs Assistance  Bed Mobility Sit to Supine  Sit to supine Min assist  General bed mobility comments assistance needed with R LE to return onto bed  Transfers  Overall transfer level Needs assistance  Equipment used Rolling walker (2 wheeled)  Transfers Sit to/from Stand  Sit to Stand Supervision  General transfer comment good technique, steady with transitions  Ambulation/Gait  Ambulation/Gait assistance Supervision  Gait Distance (Feet) 100 Feet  Assistive device Rolling walker (2 wheeled)  Gait Pattern/deviations Step-through pattern;Decreased stride length;Decreased stance time - right;Decreased step length - left  General Gait Details pt overall steady with use of RW, no LOB  or need for physical assistance, supervision for safety  Gait velocity decreased  Stairs Yes  Stairs assistance Min guard  Stair Management One rail Left;Step to pattern;Forwards  Number of Stairs 2  General stair comments cueing for sequencing, use of L hand rail with therapist providing R UE support; min guard for safety; no LOB or instability  Balance  Overall balance assessment Needs assistance  Sitting-balance support Feet supported  Sitting balance-Leahy Scale Good  Standing balance support During functional activity  Standing balance-Leahy Scale Fair  PT - End of Session  Equipment Utilized During Treatment Gait belt  Activity Tolerance Patient tolerated treatment well  Patient left in bed;with call bell/phone within reach  Nurse Communication Mobility status   PT - Assessment/Plan  PT Plan Current plan remains appropriate  PT Visit Diagnosis Other abnormalities of gait and mobility (R26.89);Pain  Pain - Right/Left Right  Pain - part of body Hip  PT Frequency (ACUTE ONLY) 7X/week  Follow Up Recommendations Home health PT;Supervision - Intermittent  PT equipment Rolling walker with 5" wheels;3in1 (PT)  AM-PAC PT "6 Clicks" Mobility Outcome Measure (Version 2)  Help needed turning from your back to your side while in a flat bed without using bedrails? 4  Help needed moving from lying on your back to sitting on the side of a flat bed without using bedrails? 4  Help needed moving to and from a bed to a chair (including a wheelchair)? 4  Help needed standing up from a chair using your arms (e.g., wheelchair or bedside chair)? 4  Help needed to walk in hospital room? 4  Help needed climbing 3-5 steps with a railing?  3  6 Click Score 23  Consider Recommendation  of Discharge To: Home with no services  PT Goal Progression  Progress towards PT goals Progressing toward goals  Acute Rehab PT Goals  PT Goal Formulation With patient  Time For Goal Achievement 09/03/19  Potential to  Achieve Goals Good  PT Time Calculation  PT Start Time (ACUTE ONLY) 1221  PT Stop Time (ACUTE ONLY) 1242  PT Time Calculation (min) (ACUTE ONLY) 21 min  PT General Charges  $$ ACUTE PT VISIT 1 Visit  PT Treatments  $Gait Training 8-22 mins   Deborah Chalk, [place], DPT  Acute Rehabilitation Services Pager [phone] Office [phone]

## 2019-08-21 NOTE — Progress Notes (Signed)
Physical Therapy Treatment Patient Details Name: Mariah Bell MRN: [id number] DOB: 10/14/1976 Today's Date: 08/21/2019    History of Present Illness Pt is a 43 y/o female s/p R total hip revision, direct anterior. PMH includes R THA, asthma, and HTN.     PT Comments    Pt making excellent progress with functional mobility and tolerated ambulation in hallway with RW and supervision for safety. PT will plan for stair training at next session.  Pt would continue to benefit from skilled physical therapy services at this time while admitted and after d/c to address the below listed limitations in order to improve overall safety and independence with functional mobility.    Follow Up Recommendations  Home health PT;Supervision - Intermittent     Equipment Recommendations  Rolling walker with 5" wheels;3in1 (PT)    Recommendations for Other Services       Precautions / Restrictions Precautions Precautions: None Precaution Comments: due to grogginess Restrictions Weight Bearing Restrictions: Yes RLE Weight Bearing: Weight bearing as tolerated    Mobility  Bed Mobility Overal bed mobility: Modified Independent             General bed mobility comments: returned to supine  Transfers Overall transfer level: Needs assistance Equipment used: Rolling walker (2 wheeled) Transfers: Sit to/from Stand Sit to Stand: Min guard         General transfer comment: good technique, min guard for safety  Ambulation/Gait Ambulation/Gait assistance: Supervision Gait Distance (Feet): 100 Feet Assistive device: Rolling walker (2 wheeled) Gait Pattern/deviations: Step-through pattern;Decreased stride length;Decreased stance time - right;Decreased step length - left Gait velocity: decreased   General Gait Details: pt overall steady with use of RW, no LOB or need for physical assistance, supervision for safety   Stairs             Wheelchair Mobility    Modified Rankin  (Stroke Patients Only)       Balance Overall balance assessment: Needs assistance Sitting-balance support: No upper extremity supported;Feet supported Sitting balance-Leahy Scale: Good     Standing balance support: During functional activity Standing balance-Leahy Scale: Fair                              Cognition Arousal/Alertness: Awake/alert Behavior During Therapy: WFL for tasks assessed/performed Overall Cognitive Status: Within Functional Limits for tasks assessed Area of Impairment: Safety/judgement                         Safety/Judgement: Decreased awareness of safety            Exercises      General Comments        Pertinent Vitals/Pain Pain Assessment: Faces Faces Pain Scale: Hurts little more Pain Location: R hip  Pain Descriptors / Indicators: Aching;Operative site guarding Pain Intervention(s): Monitored during session;Repositioned    Home Living Family/patient expects to be discharged to:: Private residence Living Arrangements: Alone Available Help at Discharge: Family;Available 24 hours/day Type of Home: House Home Access: Stairs to enter Entrance Stairs-Rails: Right Home Layout: One level Home Equipment: None Additional Comments: has lost her DME in multiple moves    Prior Function Level of Independence: Independent          PT Goals (current goals can now be found in the care plan section) Acute Rehab PT Goals Patient Stated Goal: to go home PT Goal Formulation: With patient Time For Goal Achievement:  09/03/19 Potential to Achieve Goals: Good Progress towards PT goals: Progressing toward goals    Frequency    7X/week      PT Plan Current plan remains appropriate    Co-evaluation              AM-PAC PT "6 Clicks" Mobility   Outcome Measure  Help needed turning from your back to your side while in a flat bed without using bedrails?: None Help needed moving from lying on your back to sitting on  the side of a flat bed without using bedrails?: None Help needed moving to and from a bed to a chair (including a wheelchair)?: None Help needed standing up from a chair using your arms (e.g., wheelchair or bedside chair)?: None Help needed to walk in hospital room?: None Help needed climbing 3-5 steps with a railing? : A Little 6 Click Score: 23    End of Session Equipment Utilized During Treatment: Gait belt Activity Tolerance: Patient tolerated treatment well Patient left: in chair;with call bell/phone within reach Nurse Communication: Mobility status PT Visit Diagnosis: Other abnormalities of gait and mobility (R26.89);Pain Pain - Right/Left: Right Pain - part of body: Hip     Time: 6378-5885 PT Time Calculation (min) (ACUTE ONLY): 22 min  Charges:  $Gait Training: 8-22 mins                     Deborah Chalk, [place], DPT  Acute Rehabilitation Services Pager [phone] Office [phone]     Mariah Bell 08/21/2019, 11:21 AM

## 2019-08-21 NOTE — Plan of Care (Signed)
  Problem: Education: Goal: Knowledge of General Education information will improve Description: Including pain rating scale, medication(s)/side effects and non-pharmacologic comfort measures 08/21/2019 0759 by Darleen Crocker, RN Outcome: Progressing 08/20/2019 1843 by Darleen Crocker, RN Outcome: Progressing   Problem: Activity: Goal: Risk for activity intolerance will decrease 08/21/2019 0759 by Darleen Crocker, RN Outcome: Progressing 08/20/2019 1843 by Darleen Crocker, RN Outcome: Progressing   Problem: Pain Managment: Goal: General experience of comfort will improve 08/21/2019 0759 by Darleen Crocker, RN Outcome: Progressing 08/20/2019 1843 by Darleen Crocker, RN Outcome: Progressing   Problem: Safety: Goal: Ability to remain free from injury will improve 08/21/2019 0759 by Darleen Crocker, RN Outcome: Progressing 08/20/2019 1843 by Darleen Crocker, RN Outcome: Progressing

## 2019-08-21 NOTE — Progress Notes (Signed)
Subjective: 1 Day Post-Op Procedure(s) (LRB): RIGHT TOTAL HIP REVISION (Right) Patient reports pain as moderate.    Objective: Vital signs in last 24 hours: Temp:  [97.4 F (36.3 C)-98.5 F (36.9 C)] 98.5 F (36.9 C) (10/07 0527) Pulse Rate:  [51-72] 58 (10/07 0527) Resp:  [16-20] 18 (10/07 0527) BP: (104-173)/(84-97) 144/84 (10/07 0527) SpO2:  [96 %-100 %] 99 % (10/07 0527) FiO2 (%):  [21 %] 21 % (10/06 1640) Weight:  [84.8 kg] 84.8 kg (10/06 1018)  Intake/Output from previous day: 10/06 0701 - 10/07 0700 In: 1700 [I.V.:1700] Out: 3500 [Urine:3300; Blood:200] Intake/Output this shift: No intake/output data recorded.  Recent Labs    08/21/19 0236  HGB 13.1   Recent Labs    08/21/19 0236  WBC 14.3*  RBC 4.18  HCT 39.2  PLT 365   Recent Labs    08/21/19 0236  NA 135  K 3.7  CL 101  CO2 24  BUN <5*  CREATININE 0.95  GLUCOSE 184*  CALCIUM 9.0   No results for input(s): LABPT, INR in the last 72 hours.  Sensation intact distally Intact pulses distally Dorsiflexion/Plantar flexion intact Incision: dressing C/D/I   Assessment/Plan: 1 Day Post-Op Procedure(s) (LRB): RIGHT TOTAL HIP REVISION (Right) Up with therapy Plan for discharge tomorrow Discharge home with home health      Mcarthur Rossetti 08/21/2019, 7:29 AM

## 2019-08-21 NOTE — Evaluation (Signed)
Occupational Therapy Evaluation Patient Details Name: Mariah Bell MRN: [id number] DOB: Nov 23, 1975 Today's Date: 08/21/2019    History of Present Illness Pt is a 43 y/o female s/p R total hip revision, direct anterior. PMH includes R THA, asthma, and HTN.    Clinical Impression   Pt was independent prior to admission. Presents with grogginess, likely due to medications and pt reportedly did not sleep last night. Functioning at a supervision level for safety. Educated and issued AE for LB ADL. No further OT needs.    Follow Up Recommendations  No OT follow up    Equipment Recommendations  3 in 1 bedside commode    Recommendations for Other Services       Precautions / Restrictions Precautions Precautions: Fall Precaution Comments: due to grogginess Restrictions Weight Bearing Restrictions: Yes RLE Weight Bearing: Weight bearing as tolerated      Mobility Bed Mobility Overal bed mobility: Modified Independent             General bed mobility comments: returned to supine  Transfers Overall transfer level: Needs assistance Equipment used: Rolling walker (2 wheeled) Transfers: Sit to/from Stand Sit to Stand: Supervision         General transfer comment: for safety    Balance Overall balance assessment: Needs assistance   Sitting balance-Leahy Scale: Good       Standing balance-Leahy Scale: Poor                             ADL either performed or assessed with clinical judgement   ADL Overall ADL's : Needs assistance/impaired Eating/Feeding: Independent;Sitting   Grooming: Supervision/safety;Standing   Upper Body Bathing: Set up;Sitting   Lower Body Bathing: Supervison/ safety;Sit to/from stand Lower Body Bathing Details (indicate cue type and reason): issued long handled bath sponge Upper Body Dressing : Set up;Sitting   Lower Body Dressing: Supervision/safety;With adaptive equipment;Sit to/from stand Lower Body Dressing Details  (indicate cue type and reason): educated and issued AE Toilet Transfer: Supervision/safety;Ambulation;Comfort height toilet;RW   Toileting- Clothing Manipulation and Hygiene: Supervision/safety;Sit to/from stand       Functional mobility during ADLs: Supervision/safety;Rolling walker General ADL Comments: educated in how to transport items safely with RW     Vision         Perception     Praxis      Pertinent Vitals/Pain Pain Assessment: Faces Faces Pain Scale: Hurts little more Pain Location: R hip  Pain Descriptors / Indicators: Aching;Operative site guarding Pain Intervention(s): Repositioned;Ice applied     Hand Dominance Right   Extremity/Trunk Assessment Upper Extremity Assessment Upper Extremity Assessment: Overall WFL for tasks assessed   Lower Extremity Assessment Lower Extremity Assessment: Defer to PT evaluation   Cervical / Trunk Assessment Cervical / Trunk Assessment: Normal   Communication Communication Communication: No difficulties   Cognition Arousal/Alertness: Awake/alert Behavior During Therapy: WFL for tasks assessed/performed Overall Cognitive Status: Impaired/Different from baseline Area of Impairment: Safety/judgement                         Safety/Judgement: Decreased awareness of safety         General Comments       Exercises     Shoulder Instructions      Home Living Family/patient expects to be discharged to:: Private residence Living Arrangements: Alone Available Help at Discharge: Family;Available 24 hours/day Type of Home: House Home Access: Stairs to enter [employer]  of Steps: 2 Entrance Stairs-Rails: Right Home Layout: One level     Bathroom Shower/Tub: Walk-in shower;Tub/shower unit   Bathroom Toilet: Standard     Home Equipment: None   Additional Comments: has lost her DME in multiple moves      Prior Functioning/Environment Level of Independence: Independent                  OT Problem List: Decreased activity tolerance;Impaired balance (sitting and/or standing);Decreased knowledge of use of DME or AE;Pain;Decreased safety awareness      OT Treatment/Interventions:      OT Goals(Current goals can be found in the care plan section) Acute Rehab OT Goals Patient Stated Goal: to go home  OT Frequency:     Barriers to D/C:            Co-evaluation              AM-PAC OT "6 Clicks" Daily Activity     Outcome Measure Help from another person eating meals?: None Help from another person taking care of personal grooming?: A Little Help from another person toileting, which includes using toliet, bedpan, or urinal?: A Little Help from another person bathing (including washing, rinsing, drying)?: A Little Help from another person to put on and taking off regular upper body clothing?: None Help from another person to put on and taking off regular lower body clothing?: A Little 6 Click Score: 20   End of Session Equipment Utilized During Treatment: Rolling walker  Activity Tolerance: Patient tolerated treatment well Patient left: in bed;with call bell/phone within reach;with bed alarm set  OT Visit Diagnosis: Unsteadiness on feet (R26.81);Other abnormalities of gait and mobility (R26.89);Pain                Time: 1017-5102 OT Time Calculation (min): 26 min Charges:  OT General Charges $OT Visit: 1 Visit OT Evaluation $OT Eval Low Complexity: 1 Low OT Treatments $Self Care/Home Management : 8-22 mins  Martie Round, OTR/L Acute Rehabilitation Services Pager: [phone] Office: [phone]  Evern Bio 08/21/2019, 10:54 AM

## 2019-08-22 MED ORDER — ASPIRIN 81 MG PO CHEW: 81.0000 mg | CHEWABLE_TABLET | Freq: Two times a day (BID) | ORAL | 0 refills | Status: AC

## 2019-08-22 MED ORDER — METHOCARBAMOL 500 MG PO TABS: 500.0000 mg | ORAL_TABLET | Freq: Four times a day (QID) | ORAL | 1 refills | Status: AC | PRN

## 2019-08-22 MED ORDER — OXYCODONE HCL 5 MG PO TABS: 5.0000 mg | ORAL_TABLET | ORAL | 0 refills | Status: DC | PRN

## 2019-08-22 NOTE — Progress Notes (Signed)
Subjective: 2 Days Post-Op Procedure(s) (LRB): RIGHT TOTAL HIP REVISION (Right) Patient reports pain as mild.  No complaints wants to work with physical therapy prior to discharge later today.  Objective: Vital signs in last 24 hours: Temp:  [97.6 F (36.4 C)-99.3 F (37.4 C)] 99.2 F (37.3 C) (10/08 0831) Pulse Rate:  [61-74] 71 (10/08 0831) Resp:  [16-20] 16 (10/08 0831) BP: (123-147)/(84-100) 140/93 (10/08 0831) SpO2:  [96 %-100 %] 100 % (10/08 0831)  Intake/Output from previous day: 10/07 0701 - 10/08 0700 In: 840 [P.O.:840] Out: -  Intake/Output this shift: No intake/output data recorded.  Recent Labs    08/21/19 0236  HGB 13.1   Recent Labs    08/21/19 0236  WBC 14.3*  RBC 4.18  HCT 39.2  PLT 365   Recent Labs    08/21/19 0236  NA 135  K 3.7  CL 101  CO2 24  BUN <5*  CREATININE 0.95  GLUCOSE 184*  CALCIUM 9.0   No results for input(s): LABPT, INR in the last 72 hours.  Right lower extremity: Dorsiflexion/Plantar flexion intact Incision: scant drainage Compartment soft   Assessment/Plan: 2 Days Post-Op Procedure(s) (LRB): RIGHT TOTAL HIP REVISION (Right) Up with therapy Discharge home with home health later today if safe from PT standpoint.       Mariah Bell 08/22/2019, 9:28 AM

## 2019-08-22 NOTE — Discharge Summary (Signed)
Patient ID: Mariah Bell MRN: [id number] DOB/AGE: 45-29-1977 45 y.o.  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 Arthritis   . Asthma   . Chronic back pain   . Chronic hip pain   . Hypertension     Surgeries: Procedure(s): RIGHT TOTAL HIP REVISION on 08/20/2019   Consultants:   Discharged Condition: Improved  Hospital Course: Mariah Bell is an 43 y.o. female who was admitted 08/20/2019 for operative treatment ofMechanical loosening of internal right hip prosthetic joint ([place]). Patient has severe unremitting pain that affects sleep, daily activities, and work/hobbies. After pre-op clearance the patient was taken to the operating room on 08/20/2019 and underwent  Procedure(s): RIGHT TOTAL HIP REVISION.    Patient was given perioperative antibiotics:  Anti-infectives (From admission, onward)   Start     Dose/Rate Route Frequency Ordered Stop   08/20/19 1800  ceFAZolin (ANCEF) IVPB 1 g/50 mL premix     1 g 100 mL/hr over 30 Minutes Intravenous Every 6 hours 08/20/19 1639 08/21/19 0001       Patient was given sequential compression devices, early ambulation, and chemoprophylaxis to prevent DVT.  Patient benefited maximally from hospital stay and there were no complications.    Recent vital signs:  Patient Vitals for the past 24 hrs:  BP Temp Temp src Pulse Resp SpO2  08/22/19 0831 (!) 140/93 99.2 F (37.3 C) Oral 71 16 100 %  08/22/19 0443 (!) 123/100 98.5 F (36.9 C) Oral 74 - 97 %  08/21/19 2124 (!) 147/90 99.3 F (37.4 C) Oral 68 20 96 %  08/21/19 1527 (!) 147/84 97.6 F (36.4 C) Oral 61 16 100 %     Recent laboratory studies:  Recent Labs    08/21/19 0236  WBC 14.3*  HGB 13.1  HCT 39.2  PLT 365  NA 135  K 3.7   CL 101  CO2 24  BUN <5*  CREATININE 0.95  GLUCOSE 184*  CALCIUM 9.0     Discharge Medications:   Allergies as of 08/22/2019   No Known Allergies     Medication List    TAKE these medications   albuterol 108 (90 Base) MCG/ACT inhaler Commonly known as: VENTOLIN HFA Inhale 2 puffs into the lungs every 6 (six) hours as needed for wheezing or shortness of breath.   aspirin 81 MG chewable tablet Chew 1 tablet (81 mg total) by mouth 2 (two) times daily.   lisinopril-hydrochlorothiazide 20-12.5 MG tablet Commonly known as: ZESTORETIC Take 1 tablet by mouth daily.   methocarbamol 500 MG tablet Commonly known as: ROBAXIN Take 1 tablet (500 mg total) by mouth every 6 (six) hours as needed for muscle spasms.   oxyCODONE 5 MG immediate release tablet Commonly known as: Oxy IR/ROXICODONE Take 1-2 tablets (5-10 mg total) by mouth every 4 (four) hours as needed for moderate pain (pain score 4-6).   vitamin C 500 MG tablet Commonly known as: ASCORBIC ACID Take 500 mg by mouth daily.   zolpidem 10 MG tablet Commonly known as: AMBIEN Take 10 mg by mouth at bedtime.            Durable Medical Equipment  (From admission, onward)         Start     Ordered  08/20/19 1640  DME 3 n 1  Once     08/20/19 1639   08/20/19 1640  DME Walker rolling  Once    Question:  Patient needs a walker to treat with the following condition  Answer:  S/P revision of total hip   08/20/19 1639          Diagnostic Studies: Dg Pelvis Portable  Result Date: 08/20/2019 CLINICAL DATA:  Status post right hip arthroplasty EXAM: PORTABLE PELVIS 1-2 VIEWS COMPARISON:  06/25/2019 FINDINGS: Interval revision arthroplasty of the right hip. Hardware components are in anatomic alignment. No periprosthetic fracture or dislocation. No the tip of the femoral component is not included on this exam. IMPRESSION: 1. Status post revision arthroplasty right hip. Electronically Signed   By: Signa Kell M.D.   On:  08/20/2019 16:25   Dg C-arm 1-60 Min  Result Date: 08/20/2019 CLINICAL DATA:  RIGHT hip revision EXAM: OPERATIVE RIGHT HIP (WITH PELVIS IF PERFORMED) 3 VIEWS TECHNIQUE: Fluoroscopic spot image(s) were submitted for interpretation post-operatively. COMPARISON:  06/25/2019 FLUOROSCOPY TIME:  0 minutes 29 seconds Images submitted: 3 FINDINGS: Osseous demineralization. Components of a RIGHT hip prosthesis are identified. No fracture, dislocation, or bone destruction. IMPRESSION: RIGHT hip prosthesis without acute complication. Electronically Signed   By: Ulyses Southward M.D.   On: 08/20/2019 15:53   Dg Hip Operative Unilat W Or W/o Pelvis Right  Result Date: 08/20/2019 CLINICAL DATA:  RIGHT hip revision EXAM: OPERATIVE RIGHT HIP (WITH PELVIS IF PERFORMED) 3 VIEWS TECHNIQUE: Fluoroscopic spot image(s) were submitted for interpretation post-operatively. COMPARISON:  06/25/2019 FLUOROSCOPY TIME:  0 minutes 29 seconds Images submitted: 3 FINDINGS: Osseous demineralization. Components of a RIGHT hip prosthesis are identified. No fracture, dislocation, or bone destruction. IMPRESSION: RIGHT hip prosthesis without acute complication. Electronically Signed   By: Ulyses Southward M.D.   On: 08/20/2019 15:53    Disposition:   Discharge to home.   Follow-up Information    Kathryne Hitch, MD Follow up in 2 week(s).   Specialty: Orthopedic Surgery Contact information: [address] [place] [address] [phone]            Signed: Richardean Canal 08/22/2019, 9:49 AM

## 2019-08-22 NOTE — Progress Notes (Signed)
Physical Therapy Treatment Patient Details Name: Mariah Bell MRN: [id number] DOB: 1975-12-29 Today's Date: 08/22/2019    History of Present Illness Pt is a 43 y/o female s/p R total hip revision, direct anterior. PMH includes R THA, asthma, and HTN.     PT Comments    Session focused on LE strengthening exercises as pt has previously participated in two sessions of gait training as well as stair training without difficulties. PT will continue to follow acutely to progress mobility as tolerated and to ensure a safe d/c home.    Follow Up Recommendations  Home health PT;Supervision - Intermittent     Equipment Recommendations  Rolling walker with 5" wheels;3in1 (PT)    Recommendations for Other Services       Precautions / Restrictions Precautions Precautions: None Restrictions Weight Bearing Restrictions: Yes RLE Weight Bearing: Weight bearing as tolerated    Mobility  Bed Mobility Overal bed mobility: Needs Assistance Bed Mobility: Supine to Sit     Supine to sit: Supervision        Transfers Overall transfer level: Needs assistance Equipment used: Rolling walker (2 wheeled) Transfers: Sit to/from Stand Sit to Stand: Supervision         General transfer comment: good technique, steady with transitions  Ambulation/Gait Ambulation/Gait assistance: Supervision Gait Distance (Feet): 20 Feet Assistive device: Rolling walker (2 wheeled) Gait Pattern/deviations: Step-through pattern;Decreased stride length;Decreased stance time - right;Decreased step length - left Gait velocity: decreased   General Gait Details: pt overall steady with use of RW, no LOB or need for physical assistance, supervision for safety   Stairs             Wheelchair Mobility    Modified Rankin (Stroke Patients Only)       Balance Overall balance assessment: Needs assistance Sitting-balance support: Feet supported Sitting balance-Leahy Scale: Good     Standing  balance support: During functional activity Standing balance-Leahy Scale: Fair                              Cognition Arousal/Alertness: Lethargic Behavior During Therapy: WFL for tasks assessed/performed Overall Cognitive Status: Within Functional Limits for tasks assessed                                        Exercises Total Joint Exercises Hip ABduction/ADduction: Standing;AROM;Strengthening;Right;10 reps Long Arc Quad: AROM;Strengthening;Right;10 reps;Seated Marching in Standing: AROM;Strengthening;Both;10 reps;Standing General Exercises - Lower Extremity Mini-Sqauts: AROM;Strengthening;Both;10 reps;Standing    General Comments        Pertinent Vitals/Pain Pain Assessment: No/denies pain    Home Living                      Prior Function            PT Goals (current goals can now be found in the care plan section) Acute Rehab PT Goals PT Goal Formulation: With patient Time For Goal Achievement: 09/03/19 Potential to Achieve Goals: Good Progress towards PT goals: Progressing toward goals    Frequency    7X/week      PT Plan Current plan remains appropriate    Co-evaluation              AM-PAC PT "6 Clicks" Mobility   Outcome Measure  Help needed turning from your back to your side while in a  flat bed without using bedrails?: None Help needed moving from lying on your back to sitting on the side of a flat bed without using bedrails?: None Help needed moving to and from a bed to a chair (including a wheelchair)?: None Help needed standing up from a chair using your arms (e.g., wheelchair or bedside chair)?: None Help needed to walk in hospital room?: None Help needed climbing 3-5 steps with a railing? : A Little 6 Click Score: 23    End of Session   Activity Tolerance: Patient tolerated treatment well Patient left: in chair;with call bell/phone within reach Nurse Communication: Mobility status PT Visit  Diagnosis: Other abnormalities of gait and mobility (R26.89);Pain Pain - Right/Left: Right Pain - part of body: Hip     Time: 4982-6415 PT Time Calculation (min) (ACUTE ONLY): 20 min  Charges:  $Therapeutic Activity: 8-22 mins                     Sherie Don, PT, DPT  Acute Rehabilitation Services Pager [phone] Office [place] 08/22/2019, 12:15 PM

## 2019-08-22 NOTE — Discharge Instructions (Signed)

## 2019-08-22 NOTE — TOC Transition Note (Addendum)
Transition of Care [facility]) - CM/SW Discharge Note   Patient Details  Name: Mariah Bell MRN: [id number] Date of Birth: 12-09-1975  [facility]) CM/SW Contact:  Midge Minium RN, BSN, NCM-BC, ACM-RN [phone] Phone Number: 08/22/2019, 10:15 AM   Clinical Narrative:    CM following for dispositional needs. CM spoke with the patient to discuss the POC. Patient states living at home and being independent with her ADLs PTA. Patient is s/p R total hip revision, direct anterior. PMH includes R THA, asthma, and HTN. Patient confirmed not having a PCP, but agreeable to CM assist in scheduling; will document on the patients AVS. PT/OT eval complete with HHPT/DME recommended. Patient is uninsured with [facility] HH/DME to be arranged. DME referral given to Zack ([place] liaison); [facility] referral given to [facility] liaison); AVS updated. Patient states having transportation home and someone to assist post-discharge. No further needs from CM.   Addendum: 08/22/19 @ 1210-[name] RNCM- Call received from [facility] liaison); they are unable to accept the [facility] referral d/t not servicing the patients area. The liaison reached out to various [facility] agencies with the referral declined. CM discussed Outpatient PT options, with the patient agreeable; stating she will have transportation. Outpatient PT referral sent to [place]; AVS updated. Patient will receive a call from rehab for scheduling.    Final next level of care: OP Rehab Barriers to Discharge: No Barriers Identified   Patient Goals and CMS Choice Patient states their goals for this hospitalization and ongoing recovery are:: "To get stronger" CMS Medicare.gov Compare Post Acute Care list provided to:: [facility]) Choice offered to / list presented to : [facility])   Discharge Plan and Services                DME Arranged: Gilford Rile rolling, Bedside commode DME Agency: [employer] Date DME  Agency Contacted: 08/22/19 Time DME Agency Contacted: [id number] Representative spoke with at DME Agency: [place] (liaison) HH Arranged: NA [place] Agency: NA  Social Determinants of Health ([place]) Interventions     Readmission Risk Interventions No flowsheet data found.

## 2019-08-22 NOTE — Plan of Care (Signed)
  Problem: Education: Goal: Knowledge of General Education information will improve Description: Including pain rating scale, medication(s)/side effects and non-pharmacologic comfort measures Outcome: Progressing   Problem: Health Behavior/Discharge Planning: Goal: Ability to manage health-related needs will improve Outcome: Progressing   Problem: Clinical Measurements: Goal: Will remain free from infection Outcome: Progressing   Problem: Activity: Goal: Risk for activity intolerance will decrease Outcome: Progressing   Problem: Coping: Goal: Level of anxiety will decrease Outcome: Progressing   Problem: Elimination: Goal: Will not experience complications related to urinary retention Outcome: Progressing   Problem: Pain Managment: Goal: General experience of comfort will improve Outcome: Progressing   Problem: Safety: Goal: Ability to remain free from injury will improve Outcome: Progressing

## 2019-08-22 NOTE — Plan of Care (Signed)

## 2019-08-30 ENCOUNTER — Telehealth: Payer: Self-pay

## 2019-08-30 NOTE — Telephone Encounter (Signed)
Patient aware take bandage off and may just cover incision with dry ace wrap

## 2019-08-30 NOTE — Telephone Encounter (Signed)
Patient called and stated she is burning from her bandage and or incision site. Please advise and call back [phone]. Patient had R Total Hip Revision on 08/20/2019. Thank you

## 2019-09-02 ENCOUNTER — Ambulatory Visit (HOSPITAL_COMMUNITY): Payer: Self-pay | Attending: Orthopaedic Surgery | Admitting: Physical Therapy

## 2019-09-02 ENCOUNTER — Other Ambulatory Visit: Payer: Self-pay

## 2019-09-02 ENCOUNTER — Encounter (HOSPITAL_COMMUNITY): Payer: Self-pay | Admitting: Physical Therapy

## 2019-09-02 DIAGNOSIS — M25551 Pain in right hip: Secondary | ICD-10-CM | POA: Insufficient documentation

## 2019-09-02 DIAGNOSIS — R262 Difficulty in walking, not elsewhere classified: Secondary | ICD-10-CM | POA: Insufficient documentation

## 2019-09-02 NOTE — Therapy (Signed)
[place], [place], [address] Phone: [phone]   Fax:  [phone]  Physical Therapy Evaluation  Patient Details  Name: Mariah Bell MRN: [id number] Date of Birth: [birth year] Referring Provider (PT): Jean Rosenthal   Encounter Date: 09/02/2019    Past Medical History:  Diagnosis Date  . Arthritis   . Asthma   . Chronic back pain   . Chronic hip pain   . Hypertension     Past Surgical History:  Procedure Laterality Date  . ANTERIOR HIP REVISION Right 08/20/2019   Procedure: RIGHT TOTAL HIP REVISION;  Surgeon: Mcarthur Rossetti, MD;  Location: [facility];  Service: Orthopedics;  Laterality: Right;  . TONSILLECTOMY    . TOTAL HIP ARTHROPLASTY Right 10/09/2015   Procedure: RIGHT TOTAL HIP ARTHROPLASTY ANTERIOR APPROACH;  Surgeon: Mcarthur Rossetti, MD;  Location: [facility];  Service: Orthopedics;  Laterality: Right;  . TUBAL LIGATION      There were no vitals filed for this visit.   Subjective Assessment - 09/02/19 1723    Subjective  Patient reports she has terrible burning pain along her right thigh lateral to her THA revision incision. States that she had to take her bandage off because of the burning pain and her MD thinks she was allergic to the adhesive. Reports that she is numb along the outside of her right thigh and glute region. States she had a THA 09/2015 and continued to have pain and swelling afterwards. Reports she was told she needed a revision but she put it off and ultimately elected to have surgery on 08/20/19. States she is in more pain than her previous surgery and she has a lot of difficulty moving around. States she uses a cane at home but the walker out and about. States she sees the MD tomorrow and hopes her staples come out.    Limitations  Sitting;Lifting;Standing;Walking;House hold activities    How long can you sit comfortably?  depends    How long can you stand  comfortably?  10-15 minutes    How long can you walk comfortably?  5-10 minutes    Patient Stated Goals  to have less pain and improved mobility    Currently in Pain?  Yes    Pain Score  8     Pain Location  Hip    Pain Orientation  Right    Pain Descriptors / Indicators  Aching;Burning    Pain Type  Surgical pain    Pain Onset  1 to 4 weeks ago    Pain Frequency  Constant    Aggravating Factors   walking, standing, moving    Pain Relieving Factors  medication         [facility] PT Assessment - 09/02/19 0001      Assessment   Medical Diagnosis  R THA revision     Referring Provider (PT)  Jean Rosenthal    Onset Date/Surgical Date  08/20/19    Next MD Visit  09/03/19      Precautions   Precautions  Anterior Hip;Posterior Hip   anteriolateral approach with TFL cut during surgery   Precaution Booklet Issued  No    Precaution Comments  reviewed with patient      Restrictions   Weight Bearing Restrictions  Yes    RLE Weight Bearing  Weight bearing as tolerated      Balance Screen   Has the patient fallen in the past 6 months  No  Has the patient had a decrease in activity level because of a fear of falling?   Yes    Is the patient reluctant to leave their home because of a fear of falling?   Yes      Home Environment   Living Environment  Private residence    Living Arrangements  Spouse/significant other    Available Help at Discharge  Family    Type of Home  House    Home Access  Stairs to enter    Entrance Stairs-Number of Steps  5    Entrance Stairs-Rails  Left    Home Layout  One level    Home Equipment  Walker - 2 wheels;Cane - single point      Prior Function   Level of Independence  Independent      Cognition   Overall Cognitive Status  Within Functional Limits for tasks assessed      Observation/Other Assessments   Observations  incision healing well with no signs of infection, bandage removed and staple intact. long anteriorlateral incision- about 8  inches. pocket of fluid laterally to incision (present prior to surgery). Top most staple looks like it's irritating the patient. redness/small scabs laterally to incision where bandage was    Focus on Therapeutic Outcomes (FOTO)   65% limited      Observation/Other Assessments-Edema    Edema  --   noted throughout right anterior hip     Sensation   Light Touch  Impaired by gross assessment   lateral aspect of right thigh, glut region on right     ROM / Strength   AROM / PROM / Strength  Strength;PROM      PROM   PROM Assessment Site  Hip;Knee    Right/Left Hip  Right    Right Hip Flexion  60   pulling on incision- tested supine   Right Hip External Rotation   --   not tested   Right Hip Internal Rotation   --   not tested due to recent surgery   Right Hip ABduction  10    Right Hip ADduction  --   not tested due to recent surgery   Right/Left Knee  Right    Right Knee Extension  5   lacking - tested supine- pulling on incision    Right Knee Flexion  90   supine     Strength   Overall Strength Comments  active quad set on R - pulling on incision but moderate contraction, unable to perform glute isometric with tactile/verbal cues      Palpation   Palpation comment  tenderness/burning noted medial and lateral to proximal incision      Special Tests   Other special tests  negative homan's sign      Bed Mobility   Bed Mobility  Supine to Sit;Sit to Supine    Supine to Sit  Independent   increased time, uses UE to move R LE   Sit to Supine  Independent   uses L LE to lift R LE      Transfers   Transfers  Sit to Stand    Sit to Stand  6: Modified independent (Device/Increase time)    Comments  leg kicked out and bilat UE use       Ambulation/Gait   Ambulation/Gait  Yes    Ambulation/Gait Assistance  5: Supervision    Ambulation Distance (Feet)  60 Feet    Assistive device  Rolling walker  Gait Pattern  Decreased hip/knee flexion - right;Decreased weight shift to  right;Right foot flat;Antalgic;Trunk flexed    Ambulation Surface  Level    Gait velocity  decreased    Gait Comments                 Objective measurements completed on examination: See above findings.      [facility] Adult PT Treatment/Exercise - 09/02/19 0001      Exercises   Exercises  Knee/Hip      Knee/Hip Exercises: Supine   Quad Sets  AROM;Strengthening;Right;2 sets;5 reps   5"holds   Heel Slides  AROM;Right;2 sets;10 reps   5" holds   Heel Slides Limitations  PT assist    Other Supine Knee/Hip Exercises  glut isometric - unable to perform with verbal/tactile cues; posterior pelvic tilts 2x10, 5" holds    Other Supine Knee/Hip Exercises  ankle pumps x20             PT Education - 09/02/19 1724    Education Details  in HEP, current condition, proper RW height (adjusted for patient), posture with ambulation    Person(s) Educated  Patient    Methods  Explanation;Demonstration;Handout    Comprehension  Verbalized understanding;Returned demonstration       PT Short Term Goals - 09/02/19 1731      PT SHORT TERM GOAL #1   Title  Patient will be able to ambulate at least 226 feet in 2 minutes to improve functional mobility    Baseline  60 feet with RW    Time  3    Period  Weeks    Status  New    Target Date  09/23/19      PT SHORT TERM GOAL #2   Title  Patient will be independent in HEP to improve functional outcomes.    Time  3    Period  Weeks    Status  New    Target Date  09/23/19      PT SHORT TERM GOAL #3   Title  Patient will be able to demonstrate hip flexion up to 90 degrees per MD protocol  to improve hip mobility.    Baseline  60    Time  3    Period  Weeks    Status  New    Target Date  09/23/19        PT Long Term Goals - 09/02/19 1738      PT LONG TERM GOAL #1   Title  Patient will be able to ambulate at least 500 feet to demonstrate improved functional mobilty    Time  6    Period  Weeks    Status  New    Target Date   10/14/19      PT LONG TERM GOAL #2   Title  Patient will score with <50% impairment on FOTO to demonstrate improved functional outcomes.    Time  6    Period  Weeks    Status  New    Target Date  10/14/19      PT LONG TERM GOAL #3   Title  Patient will be able to transition from sit to stand without UE assist per MD protocol    Time  6    Period  Weeks    Status  New    Target Date  10/14/19             Plan - 09/02/19 1725  Clinical Impression Statement  Patient presents with typical presentation of post operative THA symptoms. To note, patient was not able to activate her glutes with glute isometric exercise today but noted decreased sensation which may be contributing to glute amnesia. Adjusted walker to appropriate height and educated patient on post operative precautions. Patient only able to attend therapy once a week secondary to no PT visits on current insurance plan. Will focus on functional mobility and developing strong HEP for best patient outcomes. Patient would benefit from skilled physical therapy working on physical impairments and improving overall function.    Personal Factors and Comorbidities  Finances;Comorbidity 1;Comorbidity 2;Comorbidity 3+    Comorbidities  anxiety, hx of previous THA, low back pain    Examination-Activity Limitations  Bathing;Bed Mobility;Bend;Carry;Stairs;Squat;Sleep;Sit;Lift;Stand;Transfers    Examination-Participation Restrictions  Cleaning;Community Activity;Driving;Shop    Stability/Clinical Decision Making  Evolving/Moderate complexity    Clinical Decision Making  Moderate    Rehab Potential  Fair    PT Frequency  1x / week    PT Duration  8 weeks    PT Treatment/Interventions  ADLs/Self Care Home Management;Aquatic Therapy;Biofeedback;Cryotherapy;Electrical Stimulation;Iontophoresis 4mg /ml Dexamethasone;Moist Heat;Traction;Ultrasound;Therapeutic exercise;Therapeutic activities;Functional mobility training;Stair training;Gait  training;Balance training;Neuromuscular re-education;Patient/family education;Manual techniques;Scar mobilization;Passive range of motion;Dry needling    PT Next Visit Plan  f/u with glute activation, walking/posture, ROM/isometrics that are allowed.    PT Home Exercise Plan  HEP: heel slides, ankle pumps, quad set, glut sets, reviewed Hospital exercises (standing mini squats, hip abd, mini marches, LAQs)    Consulted and Agree with Plan of Care  Patient       Patient will benefit from skilled therapeutic intervention in order to improve the following deficits and impairments:  Abnormal gait, Decreased activity tolerance, Decreased balance, Decreased mobility, Decreased range of motion, Difficulty walking, Increased edema, Decreased strength, Impaired sensation, Pain  Visit Diagnosis: Difficulty in walking, not elsewhere classified  Pain in right hip     Problem List Patient Active Problem List   Diagnosis Date Noted  . S/P revision of total hip 08/20/2019  . 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 Trochanteric bursitis, right hip 07/11/2017  . Avascular necrosis of bone of right hip (HCC) 10/09/2015  . Status post total replacement of right hip 10/09/2015   5:55 PM, 09/02/19 Tereasa CoopMichele Estella Malatesta, DPT [facility]  [phone] office  [facility] [address] [place], [place], [id number] Phone: [phone]   Fax:  [phone]  Name: Mariah Bell MRN: [id number] Date of Birth: September 25, 1976

## 2019-09-03 ENCOUNTER — Ambulatory Visit (INDEPENDENT_AMBULATORY_CARE_PROVIDER_SITE_OTHER): Payer: PRIVATE HEALTH INSURANCE | Admitting: Orthopaedic Surgery

## 2019-09-03 ENCOUNTER — Encounter: Payer: Self-pay | Admitting: Orthopaedic Surgery

## 2019-09-03 DIAGNOSIS — Z96649 Presence of unspecified artificial hip joint: Secondary | ICD-10-CM

## 2019-09-03 MED ORDER — OXYCODONE HCL 5 MG PO TABS: 5.0000 mg | ORAL_TABLET | Freq: Four times a day (QID) | ORAL | 0 refills | Status: DC | PRN

## 2019-09-03 NOTE — Progress Notes (Signed)
The patient is 2 weeks status post revision arthroplasty of her right hip loose femoral component.  We are able to revise this in an anterior approach.  She is making progress with therapy.  He is in need of some more pain medication which is reasonable.  On exam remove the staples in place Steri-Strips.  Her right hip incision looks good but she does have a very large seroma.  I was able to aspirate 3 syringes worth of fluid which were all 60 cc from her hip.  There is no evidence of infection.  Her leg lengths are equal.  She will continue creaser activities as he tolerates.  We will see her back in 4 weeks to see how she is doing overall.  I would like her to stop her aspirin.  I will send in some more oxycodone as well.  If she develops a reaccumulation of the seroma that is significant she knows to call us and come see [place] sooner for an aspiration.  All question concerns were answered and addressed.

## 2019-09-09 ENCOUNTER — Encounter (HOSPITAL_COMMUNITY): Payer: Self-pay | Admitting: Physical Therapy

## 2019-09-09 ENCOUNTER — Other Ambulatory Visit: Payer: Self-pay

## 2019-09-09 ENCOUNTER — Ambulatory Visit (HOSPITAL_COMMUNITY): Payer: Self-pay | Admitting: Physical Therapy

## 2019-09-09 DIAGNOSIS — M25551 Pain in right hip: Secondary | ICD-10-CM

## 2019-09-09 DIAGNOSIS — R262 Difficulty in walking, not elsewhere classified: Secondary | ICD-10-CM
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
## 2019-09-12 ENCOUNTER — Ambulatory Visit (INDEPENDENT_AMBULATORY_CARE_PROVIDER_SITE_OTHER): Payer: PRIVATE HEALTH INSURANCE | Admitting: Orthopaedic Surgery

## 2019-09-12 ENCOUNTER — Encounter: Payer: Self-pay | Admitting: Orthopaedic Surgery

## 2019-09-12 ENCOUNTER — Other Ambulatory Visit: Payer: Self-pay

## 2019-09-12 DIAGNOSIS — Z96649 Presence of unspecified artificial hip joint: Secondary | ICD-10-CM

## 2019-09-12 MED ORDER — OXYCODONE HCL 5 MG PO TABS: 5.0000 mg | ORAL_TABLET | Freq: Four times a day (QID) | ORAL | 0 refills | Status: DC | PRN

## 2019-09-12 NOTE — Progress Notes (Signed)
The patient comes in today for continued follow-up 23 days status post a right hip revision arthroplasty.  We are revised a loose femoral component to a larger longstem fully porous-coated component.  At her last visit which was her first postoperative visit I did drain a very large seroma from her hip.  She has been doing well ambulate with a cane but does feel like the seroma is reaccumulated.  On exam she does still have a seroma at her right hip but is much less than before.  There is no redness.  There is no evidence of infection.  Her incisions well-healed.  Her leg lengths are equal.  I was able to aspirate about 70 cc of fluid off of the right hip consistent with a seroma.  This is much less than her last visit.  She will continue to ambulate with a cane and continue physical therapy.  I will see her back in 2 weeks to see if we need to aspirate her again.  I will send in some more pain medications for her as well.  All question concerns were answered and addressed.

## 2019-09-13 ENCOUNTER — Telehealth: Payer: Self-pay | Admitting: Orthopaedic Surgery

## 2019-09-13 MED ORDER — OXYCODONE HCL 5 MG PO TABS: 5.0000 mg | ORAL_TABLET | Freq: Four times a day (QID) | ORAL | 0 refills | Status: DC | PRN

## 2019-09-13 NOTE — Telephone Encounter (Signed)
Please advise 
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
## 2019-09-16 ENCOUNTER — Telehealth (HOSPITAL_COMMUNITY): Payer: Self-pay | Admitting: Physical Therapy

## 2019-09-16 ENCOUNTER — Ambulatory Visit (HOSPITAL_COMMUNITY): Payer: Self-pay | Admitting: Physical Therapy

## 2019-09-16 NOTE — Telephone Encounter (Signed)
She states she is sick and feels like she has the flu - was tested for Covid before surgery. Advised pt to get tested again.

## 2019-09-17 ENCOUNTER — Other Ambulatory Visit: Payer: Self-pay | Admitting: Orthopaedic Surgery

## 2019-09-17 NOTE — Telephone Encounter (Signed)
Continue

## 2019-09-18 ENCOUNTER — Other Ambulatory Visit: Payer: Self-pay

## 2019-09-18 ENCOUNTER — Ambulatory Visit: Payer: PRIVATE HEALTH INSURANCE | Attending: Family Medicine | Admitting: Family Medicine

## 2019-09-23 ENCOUNTER — Telehealth (HOSPITAL_COMMUNITY): Payer: Self-pay | Admitting: Physical Therapy

## 2019-09-23 ENCOUNTER — Ambulatory Visit (HOSPITAL_COMMUNITY): Payer: Self-pay | Admitting: Physical Therapy

## 2019-09-23 NOTE — Telephone Encounter (Signed)
Pt came in but was feeling so bad and she did not have test results back from Covid-19. She will return on 11/16 or call to cx.

## 2019-09-26 ENCOUNTER — Ambulatory Visit: Payer: PRIVATE HEALTH INSURANCE | Admitting: Orthopaedic Surgery

## 2019-09-30 ENCOUNTER — Encounter (HOSPITAL_COMMUNITY): Payer: PRIVATE HEALTH INSURANCE | Admitting: Physical Therapy

## 2019-09-30 ENCOUNTER — Telehealth (HOSPITAL_COMMUNITY): Payer: Self-pay | Admitting: Physical Therapy

## 2019-09-30 NOTE — Telephone Encounter (Signed)
pt called to cancel her appt due to she had not gotten her results back yet.

## 2019-10-01 ENCOUNTER — Other Ambulatory Visit: Payer: Self-pay

## 2019-10-01 ENCOUNTER — Ambulatory Visit (INDEPENDENT_AMBULATORY_CARE_PROVIDER_SITE_OTHER): Payer: PRIVATE HEALTH INSURANCE | Admitting: Orthopaedic Surgery

## 2019-10-01 DIAGNOSIS — Z96649 Presence of unspecified artificial hip joint: Secondary | ICD-10-CM

## 2019-10-01 MED ORDER — OXYCODONE HCL 5 MG PO TABS: 5.0000 mg | ORAL_TABLET | Freq: Four times a day (QID) | ORAL | 0 refills | Status: DC | PRN

## 2019-10-01 MED ORDER — METHYLPREDNISOLONE 4 MG PO TABS: ORAL_TABLET | ORAL | 0 refills | Status: AC

## 2019-10-01 MED ORDER — AZITHROMYCIN 250 MG PO TABS: ORAL_TABLET | ORAL | 0 refills | Status: AC

## 2019-10-01 NOTE — Progress Notes (Signed)
The patient comes in today for continued fashion treatment status post a right hip revision arthroplasty.  She is 6 weeks out from surgery.  She is feeling significantly congested.  She is wearing her mask and did have a COVID-19 test but the results were not back.  On examination her right hip does move smoothly and fluidly.  She now does not have the significant seroma that she had before.  Her incisions healed nicely and the leg lengths are near equal.  She still ambulating slowly with a walker.  She cannot get back into physical therapy until her Covid test results come back.  I will send in a Z-Pak and a steroid taper to help with her congestion symptoms.  I will refill her oxycodone as well.  I will see her back in 4 weeks to see how she is doing overall and I would like an AP and lateral of her right hip at that visit.

## 2019-10-29 ENCOUNTER — Encounter: Payer: Self-pay | Admitting: Orthopaedic Surgery

## 2019-10-29 ENCOUNTER — Ambulatory Visit (INDEPENDENT_AMBULATORY_CARE_PROVIDER_SITE_OTHER): Payer: PRIVATE HEALTH INSURANCE | Admitting: Orthopaedic Surgery

## 2019-10-29 ENCOUNTER — Other Ambulatory Visit: Payer: Self-pay

## 2019-10-29 ENCOUNTER — Ambulatory Visit (INDEPENDENT_AMBULATORY_CARE_PROVIDER_SITE_OTHER): Payer: PRIVATE HEALTH INSURANCE

## 2019-10-29 DIAGNOSIS — Z96649 Presence of unspecified artificial hip joint: Secondary | ICD-10-CM | POA: Diagnosis not present

## 2019-10-29 MED ORDER — HYDROCODONE-ACETAMINOPHEN 5-325 MG PO TABS: 1.0000 | ORAL_TABLET | Freq: Four times a day (QID) | ORAL | 0 refills | Status: DC | PRN

## 2019-10-29 MED ORDER — HYDROCODONE-ACETAMINOPHEN 5-325 MG PO TABS: 1.0000 | ORAL_TABLET | Freq: Four times a day (QID) | ORAL | 0 refills | Status: AC | PRN

## 2019-10-29 MED ORDER — DICLOFENAC SODIUM 75 MG PO TBEC: 75.0000 mg | DELAYED_RELEASE_TABLET | Freq: Two times a day (BID) | ORAL | 0 refills | Status: DC

## 2019-10-29 MED ORDER — DICLOFENAC SODIUM 1 % EX GEL: 4.0000 g | Freq: Four times a day (QID) | CUTANEOUS | 1 refills | Status: AC

## 2019-10-29 NOTE — Progress Notes (Signed)
HPI: Mariah Bell returns today 10 weeks status post right total hip arthroplasty revision.  She states she is having pain lateral aspect of the right hip particularly at night and whenever first getting up from seated position.  She been taking methocarbamol ibuprofen and occasional oxycodone.  She has had no new injury.  Physical exam: General well-developed well-nourished female no acute distress mood and affect appropriate. Right hip good range of motion without pain.  Tenderness over the trochanteric region.  Right calf supple nontender.  Radiographs: AP pelvis lateral view right hip: Bilateral hips well located.  No acute fractures.  Components appear all to be well-seated.  Impression: Status post right total hip revision 08/20/2019  Trochanteric bursitis  Plan: I have her apply Voltaren gel to the right hip trochanteric region up to 4 times daily.  Begin taking diclofenac orally twice daily.  No other NSAIDs while on these medications.  She is given Norco for pain.  She will follow-up with [place] in 1 month sooner if there is any questions concerns.

## 2019-10-30 ENCOUNTER — Encounter (HOSPITAL_COMMUNITY): Payer: Self-pay | Admitting: Physical Therapy

## 2019-10-30 NOTE — Therapy (Signed)
[facility] [address] [place], [place], [address] Phone: [phone]   Fax:  [phone]  Patient Details  Name: Mariah Bell MRN: [id number] Date of Birth: 1976/10/30 Referring Provider:  No ref. provider found  Encounter Date: 10/30/2019 PHYSICAL THERAPY DISCHARGE SUMMARY  Visits from Start of Care: 2  Current functional level related to goals / functional outcomes:  unknown, pt did not return for further visits.    Remaining deficits: unknown, pt did not return for further visits.       Education / Equipment: HEP Plan: Patient agrees to discharge.  Patient goals were not met. Patient is being discharged due to not returning since the last visit.  ?????        Rayetta Humphrey, PT CLT [phone] 10/30/2019, 1:53 PM  [facility] [address] [place], [place], [address] Phone: [phone]   Fax:  [phone]

## 2019-11-21 ENCOUNTER — Other Ambulatory Visit: Payer: Self-pay | Admitting: Physician Assistant

## 2019-11-28 ENCOUNTER — Ambulatory Visit: Payer: PRIVATE HEALTH INSURANCE | Admitting: Orthopaedic Surgery

## 2019-12-04 IMAGING — RF DG HIP (WITH PELVIS) OPERATIVE*R*
1 series · 3 of 3 positions shown · non-contrast
Comparison: 06/25/2019

FLUOROSCOPY TIME:  0 minutes 29 seconds

Images submitted: 3

CLINICAL DATA: RIGHT hip revision

EXAM:
OPERATIVE RIGHT HIP (WITH PELVIS IF PERFORMED) 3 VIEWS
TECHNIQUE: Fluoroscopic spot image(s) were submitted for interpretation
post-operatively.

[Series 1: unknown protocol · 0.20mm/px · 3 of 3 slices shown]
[im 1/3]
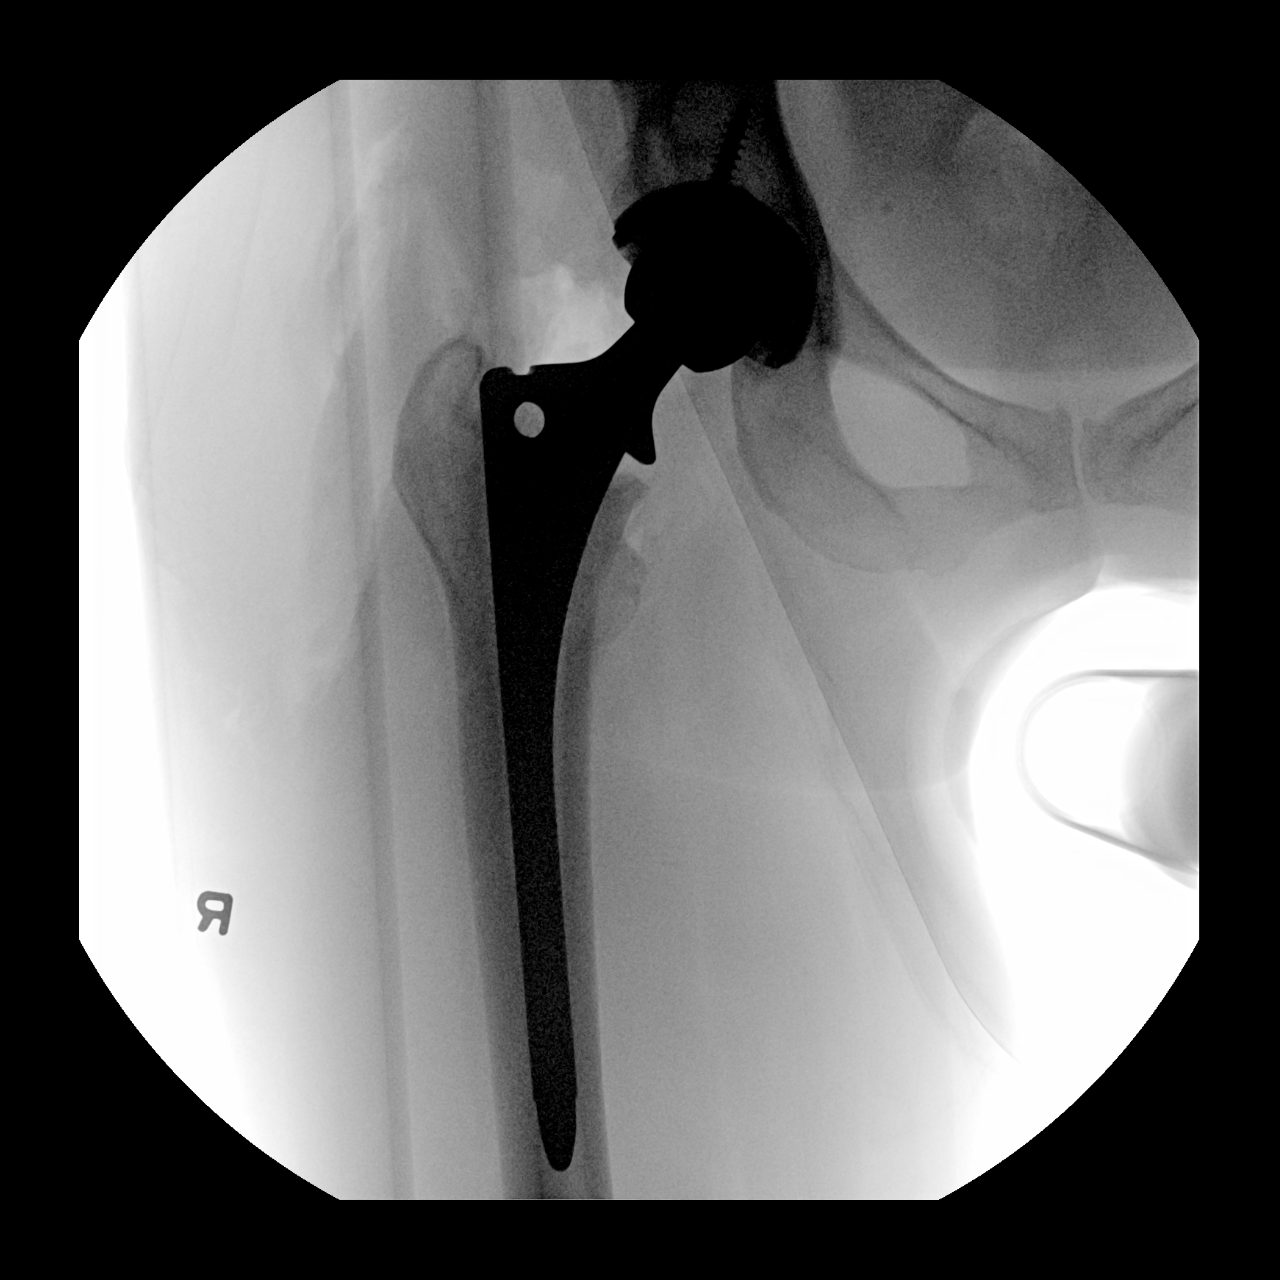
[im 2/3]
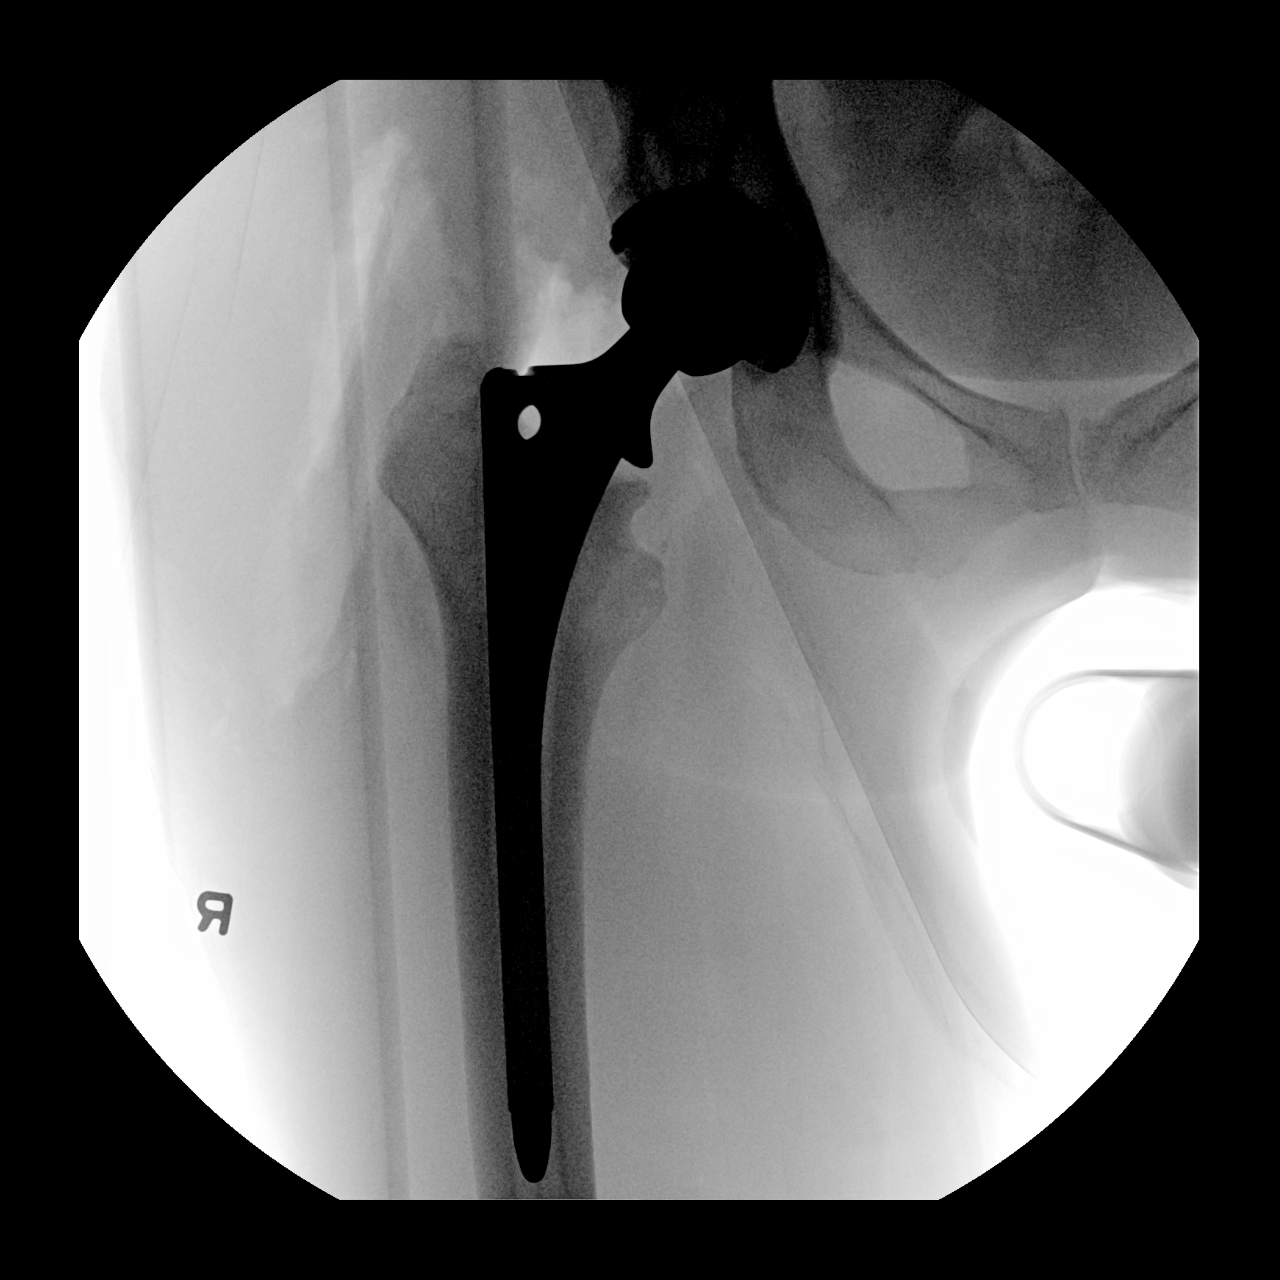
[im 3/3]
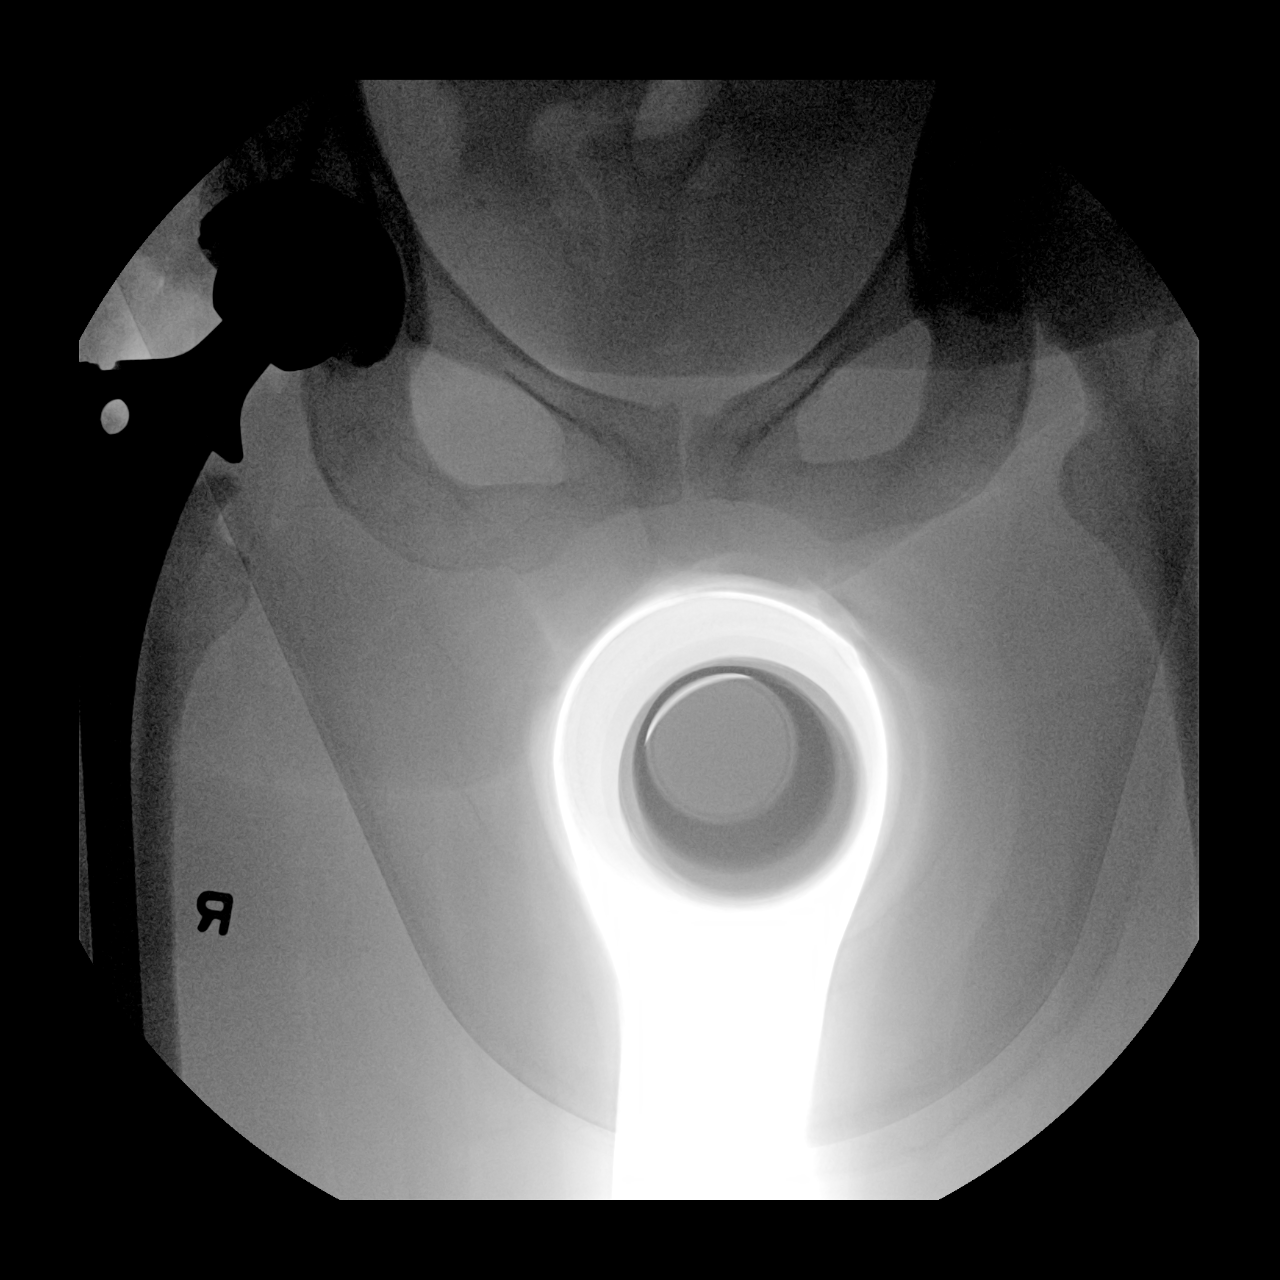

[3 of 3 positions shown; findings below may reference images not displayed]

FINDINGS: Osseous demineralization.

Components of a RIGHT hip prosthesis are identified.

No fracture, dislocation, or bone destruction.
IMPRESSION: RIGHT hip prosthesis without acute complication.

## 2020-01-06 ENCOUNTER — Ambulatory Visit: Payer: PRIVATE HEALTH INSURANCE | Admitting: Orthopaedic Surgery

## 2020-01-08 ENCOUNTER — Other Ambulatory Visit: Payer: Self-pay

## 2020-01-08 ENCOUNTER — Ambulatory Visit (INDEPENDENT_AMBULATORY_CARE_PROVIDER_SITE_OTHER): Payer: PRIVATE HEALTH INSURANCE | Admitting: Orthopaedic Surgery

## 2020-01-08 ENCOUNTER — Other Ambulatory Visit: Payer: Self-pay | Admitting: Orthopaedic Surgery

## 2020-01-08 ENCOUNTER — Encounter: Payer: Self-pay | Admitting: Orthopaedic Surgery

## 2020-01-08 DIAGNOSIS — M7061 Trochanteric bursitis, right hip: Secondary | ICD-10-CM | POA: Diagnosis not present

## 2020-01-08 DIAGNOSIS — Z96649 Presence of unspecified artificial hip joint: Secondary | ICD-10-CM | POA: Diagnosis not present

## 2020-01-08 MED ORDER — GABAPENTIN 100 MG PO CAPS: 300.0000 mg | ORAL_CAPSULE | Freq: Every day | ORAL | 1 refills | Status: DC

## 2020-01-08 MED ORDER — LIDOCAINE HCL 1 % IJ SOLN
3.0000 mL | INTRAMUSCULAR | Status: AC | PRN
  Administered 2020-01-08: 11:00:00 3 mL

## 2020-01-08 MED ORDER — METHYLPREDNISOLONE ACETATE 40 MG/ML IJ SUSP
40.0000 mg | INTRAMUSCULAR | Status: AC | PRN
  Administered 2020-01-08: 11:00:00 40 mg via INTRA_ARTICULAR

## 2020-01-08 NOTE — Progress Notes (Addendum)
Office Visit Note   Patient: Mariah Bell           Date of Birth: 06-06-1976           MRN: [id number] Visit Date: 01/08/2020              Requested by: No referring provider defined for this encounter. PCP: Patient, No Pcp Per   Assessment & Plan: Visit Diagnoses:  1. Status post revision of total hip replacement   2. Trochanteric bursitis, right hip     Plan: She shown IT band stretching exercises.  She will begin taking Neurontin at night.  We will see how she does with the Neurontin and the injection.  See her back in 2 weeks to check her response.  Questions encouraged and answered.  Follow-Up Instructions: Return in about 2 weeks (around 01/22/2020).   Orders:  Orders Placed This Encounter  Procedures  . Large Joint Inj: R greater trochanter   Meds ordered this encounter  Medications  . lidocaine (XYLOCAINE) 1 % (with pres) injection 3 mL  . methylPREDNISolone acetate (DEPO-MEDROL) injection 40 mg  . gabapentin (NEURONTIN) 100 MG capsule    Sig: Take 3 capsules (300 mg total) by mouth at bedtime.    Dispense:  30 capsule    Refill:  1      Procedures: Large Joint Inj: R greater trochanter on 01/08/2020 10:46 AM Indications: pain Details: 22 G 1.5 in needle, lateral approach  Arthrogram: No  Medications: 3 mL lidocaine 1 %; 40 mg methylPREDNISolone acetate 40 MG/ML Outcome: tolerated well, no immediate complications Procedure, treatment alternatives, risks and benefits explained, specific risks discussed. Consent was given by the patient. Immediately prior to procedure a time out was called to verify the correct patient, procedure, equipment, support staff and site/side marked as required. Patient was prepped and draped in the usual sterile fashion.       Clinical Data: No additional findings.   Subjective: Chief Complaint  Patient presents with  . Right Hip - Follow-up    HPI  Mariah Bell comes in today for follow-up of her right hip.  She  states that she is using Voltaren gel on the hip still having pain lateral aspect of the hip.  She was unable to tolerate oral diclofenac due to GI upset.  Again she is now 4 months status post right total hip revision due to loosening of the femoral component.  She had no groin pain.  Pain lateral aspect of the right hip and also in the last couple of weeks is developed a burning sensation of the lateral aspect of the hip.  She denies any back pain.  She feels as if there is some swelling about the hip.  She still using a cane to ambulate.  She had no known injury.  Review of Systems  Constitutional: Negative for chills and fever.  Respiratory: Negative for shortness of breath.   Musculoskeletal: Positive for arthralgias. Negative for back pain.  Neurological:       Burning right thigh pain     Objective: Vital Signs: There were no vitals taken for this visit.  Physical Exam Constitutional:      Appearance: She is not ill-appearing or diaphoretic.  Pulmonary:     Effort: Pulmonary effort is normal.  Neurological:     Mental Status: She is alert and oriented to person, place, and time.     Ortho Exam Right hip good range of motion without pain.  Tenderness  over the right trochanteric region.  Surgical incisions well-healed.  There is no abnormal warmth erythema effusion or signs of a seroma.  Lower extremity she has 5 5 strength throughout lower extremities against resistance negative straight leg raise bilaterally. Specialty Comments:  No specialty comments available.  Imaging: No results found.   PMFS History: Patient Active Problem List   Diagnosis Date Noted  . Status post revision of total hip replacement 09/03/2019  . S/P revision of total hip 08/20/2019  . 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 Trochanteric bursitis, right hip 07/11/2017  . Avascular necrosis of bone of right hip ([place]) 10/09/2015  . Status post total replacement  of right hip 10/09/2015   Past Medical History:  Diagnosis Date  . Arthritis   . Asthma   . Chronic back pain   . Chronic hip pain   . Hypertension     Family History  Problem Relation Age of Onset  . Diabetes Mother     Past Surgical History:  Procedure Laterality Date  . ANTERIOR HIP REVISION Right 08/20/2019   Procedure: RIGHT TOTAL HIP REVISION;  Surgeon: Mcarthur Rossetti, MD;  Location: [facility];  Service: Orthopedics;  Laterality: Right;  . TONSILLECTOMY    . TOTAL HIP ARTHROPLASTY Right 10/09/2015   Procedure: RIGHT TOTAL HIP ARTHROPLASTY ANTERIOR APPROACH;  Surgeon: Mcarthur Rossetti, MD;  Location[facility];  Service: Orthopedics;  Laterality: Right;  . TUBAL LIGATION     Social History   Occupational History  . Not on file  Tobacco Use  . Smoking status: Current Every Day Smoker    Packs/day: 2.00    Types: Cigarettes  . Smokeless tobacco: Never Used  Substance and Sexual Activity  . Alcohol use: No  . Drug use: No  . Sexual activity: Not Currently    Birth control/protection: Surgical

## 2020-01-08 NOTE — Addendum Note (Signed)
Addended by: Richardean Canal on: 01/08/2020 10:53 AM   Modules accepted: Orders

## 2020-01-08 NOTE — Telephone Encounter (Signed)
Refill

## 2020-01-22 ENCOUNTER — Other Ambulatory Visit: Payer: Self-pay

## 2020-01-22 ENCOUNTER — Encounter: Payer: Self-pay | Admitting: Orthopaedic Surgery

## 2020-01-22 ENCOUNTER — Ambulatory Visit (INDEPENDENT_AMBULATORY_CARE_PROVIDER_SITE_OTHER): Payer: PRIVATE HEALTH INSURANCE | Admitting: Orthopaedic Surgery

## 2020-01-22 DIAGNOSIS — Z96649 Presence of unspecified artificial hip joint: Secondary | ICD-10-CM

## 2020-01-22 MED ORDER — GABAPENTIN 100 MG PO CAPS: 300.0000 mg | ORAL_CAPSULE | Freq: Two times a day (BID) | ORAL | 1 refills | Status: AC

## 2020-01-22 NOTE — Progress Notes (Signed)
HPI: [name] returns today follow-up of her right hip pain.  She states she has constant burning lateral aspect of the right hip with decreased sensation.  Still feels she has some swelling in this area.  She is now 5 months status post right total hip revision.  She denies any fevers chills.  She has constant back pain as stated in previous note and understood her to say no back pain.  However she denies any numbness tingling down either leg outside of the right lateral hip decreased sensation to light touch.  She states the gabapentin helps her sleep but does not help with pain.  The injection right greater trochanteric area really did not help with the burning pain.  Physical exam: Right hip good range of motion without pain.  Negative straight leg raise bilaterally.  She has fullness over the lateral aspect of the hip but there is no abnormal warmth erythema or signs of seroma.  Surgical incision is healing well no signs of infection.  Subjective decreased sensation over the lateral right hip proximally only.  Impression: Right hip paresthesia Status post right total hip arthroplasty revision 5 months  Plan: We will increase her gabapentin.  We will see her back in 3 months to see how she is doing overall.  Reassurance given by Dr. Magnus Ivan that this should resolve with time.  Questions encouraged and answered

## 2020-04-23 ENCOUNTER — Ambulatory Visit: Payer: PRIVATE HEALTH INSURANCE | Admitting: Orthopaedic Surgery

## 2022-10-19 ENCOUNTER — Other Ambulatory Visit (HOSPITAL_COMMUNITY): Payer: Self-pay

## 2023-01-24 ENCOUNTER — Ambulatory Visit: Payer: PRIVATE HEALTH INSURANCE | Admitting: Physician Assistant

## 2023-02-20 ENCOUNTER — Ambulatory Visit: Payer: PRIVATE HEALTH INSURANCE | Admitting: Physician Assistant

## 2024-05-13 ENCOUNTER — Ambulatory Visit: Payer: Self-pay

## 2024-05-13 NOTE — Telephone Encounter (Signed)
   Copied from CRM [id number]. Topic: Clinical - Red Word Triage >> May 13, 2024 12:06 PM Fonda T wrote: Red Word that prompted transfer to Nurse Triage: Patient calling, states Blood pressure has been running high lately. Patient states she is having swelling in lower extremities. Reason for Disposition  [1] Systolic BP  >= 130 OR Diastolic >= 80 AND [2] not taking BP medications  Answer Assessment - Initial Assessment Questions 1. ONSET: When did the swelling start? (e.g., minutes, hours, days)     Today 2. LOCATION: What part of the leg is swollen?  Are both legs swollen or just one leg?     Bilateral knees  3. SEVERITY: How bad is the swelling? (e.g., localized; mild, moderate, severe)   - Localized: Small area of swelling localized to one leg.   - MILD pedal edema: Swelling limited to foot and ankle, pitting edema < 1/4 inch (6 mm) deep, rest and elevation eliminate most or all swelling.   - MODERATE edema: Swelling of lower leg to knee, pitting edema > 1/4 inch (6 mm) deep, rest and elevation only partially reduce swelling.   - SEVERE edema: Swelling extends above knee, facial or hand swelling present.      mild 4. REDNESS: Does the swelling look red or infected?     No 5. PAIN: Is the swelling painful to touch? If Yes, ask: How painful is it?   (Scale 1-10; mild, moderate or severe)     no 6. FEVER: Do you have a fever? If Yes, ask: What is it, how was it measured, and when did it start?      denies 7. CAUSE: What do you think is causing the leg swelling?     unsure 8. MEDICAL HISTORY: Do you have a history of blood clots (e.g., DVT), cancer, heart failure, kidney disease, or liver failure?     Denies  9. RECURRENT SYMPTOM: Have you had leg swelling before? If Yes, ask: When was the last time? What happened that time?     Never  10. OTHER SYMPTOMS: Do you have any other symptoms? (e.g., chest pain, difficulty breathing)       Denies.   Current bp  148/96  Answer Assessment - Initial Assessment Questions 1. BLOOD PRESSURE: What is the blood pressure? Did you take at least two measurements 5 minutes apart?     148/96 2. ONSET: When did you take your blood pressure?     Today 3. HOW: How did you take your blood pressure? (e.g., automatic home BP monitor, visiting nurse)     Home cuff 4. HISTORY: Do you have a history of high blood pressure?     No 5. MEDICINES: Are you taking any medicines for blood pressure? Have you missed any doses recently?     No 6. OTHER SYMPTOMS: Do you have any symptoms? (e.g., blurred vision, chest pain, difficulty breathing, headache, weakness)     Knee swelling  Protocols used: Leg Swelling and Edema-A-AH, Blood Pressure - High-A-AH

## 2024-05-28 ENCOUNTER — Encounter: Payer: Self-pay | Admitting: Nurse Practitioner

## 2024-05-28 ENCOUNTER — Ambulatory Visit: Payer: Self-pay | Admitting: Nurse Practitioner

## 2024-05-28 VITALS — BP 172/116 | HR 73 | Ht 69.0 in | Wt 180.0 lb

## 2024-05-28 DIAGNOSIS — R7303 Prediabetes: Secondary | ICD-10-CM

## 2024-05-28 DIAGNOSIS — E782 Mixed hyperlipidemia: Secondary | ICD-10-CM | POA: Diagnosis not present

## 2024-05-28 DIAGNOSIS — I10 Essential (primary) hypertension: Secondary | ICD-10-CM

## 2024-05-28 DIAGNOSIS — M87051 Idiopathic aseptic necrosis of right femur: Secondary | ICD-10-CM

## 2024-05-28 DIAGNOSIS — N951 Menopausal and female climacteric states: Secondary | ICD-10-CM

## 2024-05-28 MED ORDER — AMLODIPINE BESYLATE 5 MG PO TABS: 5.0000 mg | ORAL_TABLET | Freq: Every day | ORAL | 3 refills | Status: AC

## 2024-05-28 MED ORDER — CLONIDINE HCL 0.1 MG PO TABS: 0.1000 mg | ORAL_TABLET | Freq: Every day | ORAL | 3 refills | Status: AC

## 2024-05-28 NOTE — Progress Notes (Addendum)
 New Patient Office Visit  Subjective    Patient ID: Mariah Bell, female    DOB: 04/16/1976  Age: 48 y.o. MRN: [id number]  CC:  Chief Complaint  Patient presents with   Establish Care    HPI Mariah Bell presents to establish care Patient here today as a new patient, BP not controlled, has not been taking any BP medication.  Also c/o hot flashes and legs swelling.  Works 3rd shift.  Eye appt is scheduled for 06/10/24.  Dental appt is scheduled for tomorrow.  Last mammogram was in winter of 2024 and last pap screen was in 2021.    Outpatient Encounter Medications as of 05/28/2024  Medication Sig   amLODipine  (NORVASC ) 5 MG tablet Take 1 tablet (5 mg total) by mouth daily.   cloNIDine  (CATAPRES ) 0.1 MG tablet Take 1 tablet (0.1 mg total) by mouth daily.   lisinopril -hydrochlorothiazide  (PRINZIDE ,ZESTORETIC ) 20-12.5 MG per tablet Take 1 tablet by mouth daily.    albuterol  (PROVENTIL  HFA;VENTOLIN  HFA) 108 (90 BASE) MCG/ACT inhaler Inhale 2 puffs into the lungs every 6 (six) hours as needed for wheezing or shortness of breath. (Patient not taking: Reported on 05/28/2024)   aspirin  81 MG chewable tablet Chew 1 tablet (81 mg total) by mouth 2 (two) times daily. (Patient not taking: Reported on 05/28/2024)   azithromycin  (ZITHROMAX ) 250 MG tablet Take two tablets on day one and then one tablet daily for the following 4 days. (Patient not taking: Reported on 05/28/2024)   diclofenac  (VOLTAREN ) 75 MG EC tablet TAKE 1 TABLET BY MOUTH TWICE A DAY (Patient not taking: Reported on 05/28/2024)   diclofenac  Sodium (VOLTAREN ) 1 % GEL Apply 4 g topically 4 (four) times daily. (Patient not taking: Reported on 05/28/2024)   gabapentin  (NEURONTIN ) 100 MG capsule Take 3 capsules (300 mg total) by mouth 2 (two) times daily. (Patient not taking: Reported on 05/28/2024)   methocarbamol  (ROBAXIN ) 500 MG tablet Take 1 tablet (500 mg total) by mouth every 6 (six) hours as needed for muscle spasms. (Patient not  taking: Reported on 05/28/2024)   methylPREDNISolone  (MEDROL ) 4 MG tablet Medrol  dose pack. Take as instructed (Patient not taking: Reported on 05/28/2024)   vitamin C  (ASCORBIC ACID ) 500 MG tablet Take 500 mg by mouth daily. (Patient not taking: Reported on 05/28/2024)   zolpidem  (AMBIEN ) 10 MG tablet Take 10 mg by mouth at bedtime.  (Patient not taking: Reported on 05/28/2024)   No facility-administered encounter medications on file as of 05/28/2024.    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   Procedure: RIGHT TOTAL HIP ARTHROPLASTY ANTERIOR APPROACH;  Surgeon: Lonni GRADE Vernetta, MD;  Location: [facility];  Service: Orthopedics;  Laterality: Right;   TUBAL LIGATION      Family History  Problem Relation Age of Onset   Diabetes Mother     Social History   Socioeconomic History   Marital status: Single    Spouse name: Not on file   Number of children: Not on file   Years of education: Not on file  Highest education level: Not on file  Occupational History   Not on file  Tobacco Use   Smoking status: Every Day    Current packs/day: 2.00    Types: Cigarettes   Smokeless tobacco: Never  Vaping Use   Vaping status: Never Used  Substance and Sexual Activity   Alcohol use: No   Drug use: No   Sexual activity: Not Currently    Birth control/protection: Surgical  Other Topics Concern   Not on file  Social History Narrative   Not on file   Social Drivers of Health   Financial Resource Strain: Not on file  Food Insecurity: Not on file  Transportation Needs: Not on file  Physical Activity: Not on file   Stress: Not on file  Social Connections: Not on file  Intimate Partner Violence: Not on file    ROS      Objective    BP (!) 172/116   Pulse 73   Ht 5' 9 (1.753 m)   Wt 180 lb (81.6 kg)   SpO2 98%   BMI 26.58 kg/m   Physical Exam Vitals and nursing note reviewed.  Constitutional:      Appearance: Normal appearance.  HENT:     Head: Normocephalic.     Mouth/Throat:     Mouth: Mucous membranes are dry.  Eyes:     Pupils: Pupils are equal, round, and reactive to light.  Cardiovascular:     Rate and Rhythm: Normal rate and regular rhythm.     Pulses: Normal pulses.     Heart sounds: Normal heart sounds.  Pulmonary:     Effort: Pulmonary effort is normal.     Breath sounds: Normal breath sounds.  Musculoskeletal:        General: Tenderness present.     Cervical back: Normal range of motion and neck supple.  Skin:    General: Skin is warm and dry.  Neurological:     Mental Status: She is alert and oriented to person, place, and time.  Psychiatric:        Mood and Affect: Mood normal.        Behavior: Behavior normal.         Assessment & Plan:   Problem List Items Addressed This Visit       Cardiovascular and Mediastinum   Hot flashes, menopausal   Relevant Medications   amLODipine  (NORVASC ) 5 MG tablet   cloNIDine  (CATAPRES ) 0.1 MG tablet   RESOLVED: Essential hypertension, benign   Relevant Medications   amLODipine  (NORVASC ) 5 MG tablet   cloNIDine  (CATAPRES ) 0.1 MG tablet   Other Relevant Orders   Comprehensive Metabolic Panel (CMET) (Completed)   TSH (Completed)     Musculoskeletal and Integument   Avascular necrosis of bone of right hip (HCC)     Other   Mixed hyperlipidemia   Relevant Medications   amLODipine  (NORVASC ) 5 MG tablet   cloNIDine  (CATAPRES ) 0.1 MG tablet   Other Relevant Orders   Lipid Profile (Completed)   Prediabetes - Primary   Relevant Orders   HgB A1c (Completed)    Return in about 2 weeks (around 06/11/2024).    Neale Carpen, NP

## 2024-05-28 NOTE — Patient Instructions (Signed)
 1) Change amlodipine  from 10 mg due to leg swelling to 5 mg daily 2) No lisinopril /hydrochlorothiazide , pt is out of anyway and  change to clonidine  0.1 mg daily (also to see of hot flashes improve) 3) Fasting labs prior to next appt 4) Next appt in 2 weeks to see BP results

## 2024-05-29 ENCOUNTER — Other Ambulatory Visit: Payer: Self-pay

## 2024-05-29 ENCOUNTER — Ambulatory Visit: Payer: Self-pay

## 2024-05-29 LAB — COMPREHENSIVE METABOLIC PANEL WITH GFR
ALT: 9 IU/L (ref 0–32)
AST: 13 IU/L (ref 0–40)
Albumin: 4.2 g/dL (ref 3.9–4.9)
Alkaline Phosphatase: 72 IU/L (ref 44–121)
BUN/Creatinine Ratio: 10 (ref 9–23)
BUN: 9 mg/dL (ref 6–24)
Bilirubin Total: 0.4 mg/dL (ref 0.0–1.2)
CO2: 22 mmol/L (ref 20–29)
Calcium: 9.3 mg/dL (ref 8.7–10.2)
Chloride: 103 mmol/L (ref 96–106)
Creatinine, Ser: 0.91 mg/dL (ref 0.57–1.00)
Globulin, Total: 3.3 g/dL (ref 1.5–4.5)
Glucose: 92 mg/dL (ref 70–99)
Potassium: 4.3 mmol/L (ref 3.5–5.2)
Sodium: 141 mmol/L (ref 134–144)
Total Protein: 7.5 g/dL (ref 6.0–8.5)
eGFR: 77 mL/min/1.73 (ref >59)

## 2024-05-29 LAB — LIPID PANEL
Chol/HDL Ratio: 4.5 ratio — ABNORMAL HIGH (ref 0.0–4.4)
Cholesterol, Total: 219 mg/dL — ABNORMAL HIGH (ref 100–199)
HDL: 49 mg/dL (ref >39)
LDL Chol Calc (NIH): 144 mg/dL — ABNORMAL HIGH (ref 0–99)
Triglycerides: 144 mg/dL (ref 0–149)
VLDL Cholesterol Cal: 26 mg/dL (ref 5–40)

## 2024-05-29 LAB — TSH: TSH: 1.61 u[IU]/mL (ref 0.450–4.500)

## 2024-05-29 LAB — HEMOGLOBIN A1C
Est. average glucose Bld gHb Est-mCnc: 120 mg/dL
Hgb A1c MFr Bld: 5.8 % — ABNORMAL HIGH (ref 4.8–5.6)

## 2024-05-29 MED ORDER — ROSUVASTATIN CALCIUM 20 MG PO TABS: 20.0000 mg | ORAL_TABLET | Freq: Every day | ORAL | 1 refills | Status: AC

## 2024-06-01 DIAGNOSIS — R7303 Prediabetes: Secondary | ICD-10-CM | POA: Diagnosis not present

## 2024-06-10 DIAGNOSIS — R7303 Prediabetes: Secondary | ICD-10-CM | POA: Diagnosis not present

## 2024-06-11 ENCOUNTER — Ambulatory Visit: Admitting: Nurse Practitioner

## 2024-06-17 DIAGNOSIS — M7989 Other specified soft tissue disorders: Secondary | ICD-10-CM | POA: Diagnosis not present

## 2024-06-17 DIAGNOSIS — S62661A Nondisplaced fracture of distal phalanx of left index finger, initial encounter for closed fracture: Secondary | ICD-10-CM | POA: Diagnosis not present

## 2024-06-17 DIAGNOSIS — I2510 Atherosclerotic heart disease of native coronary artery without angina pectoris: Secondary | ICD-10-CM | POA: Diagnosis not present

## 2024-06-17 DIAGNOSIS — X58XXXA Exposure to other specified factors, initial encounter: Secondary | ICD-10-CM | POA: Diagnosis not present

## 2024-06-17 DIAGNOSIS — I10 Essential (primary) hypertension: Secondary | ICD-10-CM | POA: Diagnosis not present

## 2024-06-19 ENCOUNTER — Ambulatory Visit: Payer: Self-pay | Admitting: Nurse Practitioner

## 2024-06-20 ENCOUNTER — Telehealth: Payer: Self-pay

## 2024-06-20 NOTE — Telephone Encounter (Signed)
 Copied from CRM [id number]. Topic: Appointments - Appointment Scheduling >> Jun 20, 2024 12:24 PM Marissa P wrote: Patient/patient representative is calling to schedule an appointment. Refer to attachments for appointment information.  Needs to recheck blood pressure and get rescheduled please wont allow me to, please call patient

## 2024-06-20 NOTE — Telephone Encounter (Signed)
 Called patient no answer.

## 2024-07-25 ENCOUNTER — Emergency Department (HOSPITAL_COMMUNITY)

## 2024-07-25 ENCOUNTER — Encounter (HOSPITAL_COMMUNITY): Payer: Self-pay

## 2024-07-25 ENCOUNTER — Other Ambulatory Visit: Payer: Self-pay

## 2024-07-25 ENCOUNTER — Emergency Department (HOSPITAL_COMMUNITY)
Admission: EM | Admit: 2024-07-25 | Discharge: 2024-07-25 | Disposition: A | Discharge status: Home / Self Care | Attending: Emergency Medicine | Admitting: Emergency Medicine

## 2024-07-25 DIAGNOSIS — I10 Essential (primary) hypertension: Secondary | ICD-10-CM | POA: Diagnosis not present

## 2024-07-25 DIAGNOSIS — Z7982 Long term (current) use of aspirin: Secondary | ICD-10-CM | POA: Diagnosis not present

## 2024-07-25 DIAGNOSIS — R0789 Other chest pain: Secondary | ICD-10-CM | POA: Diagnosis not present

## 2024-07-25 DIAGNOSIS — R112 Nausea with vomiting, unspecified: Secondary | ICD-10-CM | POA: Diagnosis not present

## 2024-07-25 DIAGNOSIS — R0602 Shortness of breath: Secondary | ICD-10-CM | POA: Diagnosis not present

## 2024-07-25 DIAGNOSIS — Z79899 Other long term (current) drug therapy: Secondary | ICD-10-CM | POA: Diagnosis not present

## 2024-07-25 DIAGNOSIS — I517 Cardiomegaly: Secondary | ICD-10-CM | POA: Diagnosis not present

## 2024-07-25 DIAGNOSIS — R079 Chest pain, unspecified: Secondary | ICD-10-CM | POA: Diagnosis not present

## 2024-07-25 LAB — COMPREHENSIVE METABOLIC PANEL WITH GFR
ALT: 11 U/L (ref 0–44)
AST: 14 U/L — ABNORMAL LOW (ref 15–41)
Albumin: 3.7 g/dL (ref 3.5–5.0)
Alkaline Phosphatase: 61 U/L (ref 38–126)
Anion gap: 14 (ref 5–15)
BUN: 12 mg/dL (ref 6–20)
CO2: 25 mmol/L (ref 22–32)
Calcium: 8.8 mg/dL — ABNORMAL LOW (ref 8.9–10.3)
Chloride: 102 mmol/L (ref 98–111)
Creatinine, Ser: 1.04 mg/dL — ABNORMAL HIGH (ref 0.44–1.00)
GFR, Estimated: >60 mL/min (ref >60)
Glucose, Bld: 112 mg/dL — ABNORMAL HIGH (ref 70–99)
Potassium: 3.6 mmol/L (ref 3.5–5.1)
Sodium: 141 mmol/L (ref 135–145)
Total Bilirubin: 0.5 mg/dL (ref 0.0–1.2)
Total Protein: 7.8 g/dL (ref 6.5–8.1)

## 2024-07-25 LAB — CBC WITH DIFFERENTIAL/PLATELET
Abs Immature Granulocytes: 0.03 K/uL (ref 0.00–0.07)
Basophils Absolute: 0.1 K/uL (ref 0.0–0.1)
Basophils Relative: 1 %
Eosinophils Absolute: 0.3 K/uL (ref 0.0–0.5)
Eosinophils Relative: 3 %
HCT: 38.9 % (ref 36.0–46.0)
Hemoglobin: 13.1 g/dL (ref 12.0–15.0)
Immature Granulocytes: 0 %
Lymphocytes Relative: 34 %
Lymphs Abs: 2.8 K/uL (ref 0.7–4.0)
MCH: 31.6 pg (ref 26.0–34.0)
MCHC: 33.7 g/dL (ref 30.0–36.0)
MCV: 94 fL (ref 80.0–100.0)
Monocytes Absolute: 0.6 K/uL (ref 0.1–1.0)
Monocytes Relative: 7 %
Neutro Abs: 4.6 K/uL (ref 1.7–7.7)
Neutrophils Relative %: 55 %
Platelets: 302 K/uL (ref 150–400)
RBC: 4.14 MIL/uL (ref 3.87–5.11)
RDW: 13.6 % (ref 11.5–15.5)
WBC: 8.4 K/uL (ref 4.0–10.5)
nRBC: 0 % (ref 0.0–0.2)

## 2024-07-25 LAB — TROPONIN I (HIGH SENSITIVITY)
Troponin I (High Sensitivity): 21 ng/L — ABNORMAL HIGH (ref <18)
Troponin I (High Sensitivity): 22 ng/L — ABNORMAL HIGH (ref <18)

## 2024-07-25 MED ORDER — NITROGLYCERIN 0.4 MG SL SUBL
0.4000 mg | SUBLINGUAL_TABLET | Freq: Once | SUBLINGUAL | Status: AC
  Administered 2024-07-25: 0.4 mg via SUBLINGUAL
  Filled 2024-07-25: qty 1

## 2024-07-25 MED ORDER — SODIUM CHLORIDE 0.9 % IV BOLUS
500.0000 mL | Freq: Once | INTRAVENOUS | Status: AC
  Administered 2024-07-25: 500 mL via INTRAVENOUS

## 2024-07-25 MED ORDER — PANTOPRAZOLE SODIUM 40 MG IV SOLR
40.0000 mg | Freq: Once | INTRAVENOUS | Status: AC
  Administered 2024-07-25: 40 mg via INTRAVENOUS
  Filled 2024-07-25: qty 10

## 2024-07-25 NOTE — ED Notes (Signed)
 ED Provider at bedside.

## 2024-07-25 NOTE — Discharge Instructions (Addendum)
 Start taking 1 baby aspirin  a day.  Follow-up with a cardiologist in the next couple weeks.  Return sooner if any problems

## 2024-07-25 NOTE — ED Triage Notes (Signed)
 Pain started at 6am this morning. Only taken BP today. Denies N/v, SOB, or dizziness.

## 2024-07-27 NOTE — ED Provider Notes (Signed)
 [place] EMERGENCY DEPARTMENT AT [facility] Provider Note   CSN: [id number] Arrival date & time: 07/25/24  1835     Patient presents with: Chest Pain   Mariah Bell is a 48 y.o. female.   Patient complains of some chest discomfort.  She has a history of hypertension  The history is provided by the patient and medical records.  Chest Pain Pain location:  Substernal area Pain quality: aching   Pain radiates to:  Does not radiate Pain severity:  Mild Onset quality:  Sudden Timing:  Intermittent Progression:  Resolved Chronicity:  New Context: not breathing   Associated symptoms: no abdominal pain, no back pain, no cough, no fatigue and no headache        Prior to Admission medications   Medication Sig Start Date End Date Taking? Authorizing Provider  albuterol  (PROVENTIL  HFA;VENTOLIN  HFA) 108 (90 BASE) MCG/ACT inhaler Inhale 2 puffs into the lungs every 6 (six) hours as needed for wheezing or shortness of breath. Patient not taking: Reported on 05/28/2024    [provider]  amLODipine  (NORVASC ) 5 MG tablet Take 1 tablet (5 mg total) by mouth daily. 05/28/24   Glennon Sand, NP  aspirin  81 MG chewable tablet Chew 1 tablet (81 mg total) by mouth 2 (two) times daily. Patient not taking: Reported on 05/28/2024 08/22/19   Vernetta Lonni GRADE, MD  azithromycin  (ZITHROMAX ) 250 MG tablet Take two tablets on day one and then one tablet daily for the following 4 days. Patient not taking: Reported on 05/28/2024 10/01/19   Vernetta Lonni GRADE, MD  cloNIDine  (CATAPRES ) 0.1 MG tablet Take 1 tablet (0.1 mg total) by mouth daily. 05/28/24   Boswell, Chelsa, NP  diclofenac  (VOLTAREN ) 75 MG EC tablet TAKE 1 TABLET BY MOUTH TWICE A DAY Patient not taking: Reported on 05/28/2024 11/21/19   Vernetta Lonni GRADE, MD  diclofenac  Sodium (VOLTAREN ) 1 % GEL Apply 4 g topically 4 (four) times daily. Patient not taking: Reported on 05/28/2024 10/29/19   Gretta Bertrum ORN,  PA-C  gabapentin  (NEURONTIN ) 100 MG capsule Take 3 capsules (300 mg total) by mouth 2 (two) times daily. Patient not taking: Reported on 05/28/2024 01/22/20   Gretta Bertrum ORN, PA-C  lisinopril -hydrochlorothiazide  (PRINZIDE ,ZESTORETIC ) 20-12.5 MG per tablet Take 1 tablet by mouth daily.  01/28/14   [provider]  methocarbamol  (ROBAXIN ) 500 MG tablet Take 1 tablet (500 mg total) by mouth every 6 (six) hours as needed for muscle spasms. Patient not taking: Reported on 05/28/2024 08/22/19   Vernetta Lonni GRADE, MD  methylPREDNISolone  (MEDROL ) 4 MG tablet Medrol  dose pack. Take as instructed Patient not taking: Reported on 05/28/2024 10/01/19   Vernetta Lonni GRADE, MD  rosuvastatin  (CRESTOR ) 20 MG tablet Take 1 tablet (20 mg total) by mouth at bedtime. 05/29/24   Glennon Sand, NP  vitamin C  (ASCORBIC ACID ) 500 MG tablet Take 500 mg by mouth daily. Patient not taking: Reported on 05/28/2024    [provider]  zolpidem  (AMBIEN ) 10 MG tablet Take 10 mg by mouth at bedtime.  Patient not taking: Reported on 05/28/2024 06/09/14   [provider]    Allergies: Patient has no known allergies.    Review of Systems  Constitutional:  Negative for appetite change and fatigue.  HENT:  Negative for congestion, ear discharge and sinus pressure.   Eyes:  Negative for discharge.  Respiratory:  Negative for cough.   Cardiovascular:  Positive for chest pain.  Gastrointestinal:  Negative for abdominal pain and  diarrhea.  Genitourinary:  Negative for frequency and hematuria.  Musculoskeletal:  Negative for back pain.  Skin:  Negative for rash.  Neurological:  Negative for seizures and headaches.  Psychiatric/Behavioral:  Negative for hallucinations.     Updated Vital Signs BP 135/87 (BP Location: Right Arm)   Pulse (!) 52   Temp 98.3 F (36.8 C) (Oral)   Resp 18   Ht 5' 9 (1.753 m)   Wt 82.6 kg   SpO2 100%   BMI 26.88 kg/m   Physical Exam Vitals and nursing note  reviewed.  Constitutional:      Appearance: She is well-developed.  HENT:     Head: Normocephalic.     Nose: Nose normal.  Eyes:     General: No scleral icterus.    Conjunctiva/sclera: Conjunctivae normal.  Neck:     Thyroid: No thyromegaly.  Cardiovascular:     Rate and Rhythm: Normal rate and regular rhythm.     Heart sounds: No murmur heard.    No friction rub. No gallop.  Pulmonary:     Breath sounds: No stridor. No wheezing or rales.  Chest:     Chest wall: No tenderness.  Abdominal:     General: There is no distension.     Tenderness: There is no abdominal tenderness. There is no rebound.  Musculoskeletal:        General: Normal range of motion.     Cervical back: Neck supple.  Lymphadenopathy:     Cervical: No cervical adenopathy.  Skin:    Findings: No erythema or rash.  Neurological:     Mental Status: She is alert and oriented to person, place, and time.     Motor: No abnormal muscle tone.     Coordination: Coordination normal.  Psychiatric:        Behavior: Behavior normal.     (all labs ordered are listed, but only abnormal results are displayed) Labs Reviewed  COMPREHENSIVE METABOLIC PANEL WITH GFR - Abnormal; Notable for the following components:      Result Value   Glucose, Bld 112 (*)    Creatinine, Ser 1.04 (*)    Calcium  8.8 (*)    AST 14 (*)    All other components within normal limits  TROPONIN I (HIGH SENSITIVITY) - Abnormal; Notable for the following components:   Troponin I (High Sensitivity) 21 (*)    All other components within normal limits  TROPONIN I (HIGH SENSITIVITY) - Abnormal; Notable for the following components:   Troponin I (High Sensitivity) 22 (*)    All other components within normal limits  CBC WITH DIFFERENTIAL/PLATELET    EKG: EKG Interpretation Date/Time:  Thursday July 25 2024 19:00:32 EDT Ventricular Rate:  66 PR Interval:  126 QRS Duration:  92 QT Interval:  412 QTC Calculation: 431 R Axis:   60  Text  Interpretation: Normal sinus rhythm Left ventricular hypertrophy with repolarization abnormality ( Sokolow-Lyon ) Abnormal ECG When compared with ECG of 16-Aug-2019 13:45, T wave inversion now evident in Inferior leads Confirmed by Suzette Pac [phone]) on 07/25/2024 9:59:33 PM  Radiology: [facility] Chest 2 View Result Date: 07/25/2024 EXAM: 2 VIEW(S) XRAY OF THE CHEST 07/25/2024 07:15:00 PM COMPARISON: 07/30/2012 CLINICAL HISTORY: SOB. Per triage; Pain started at 6am this morning. Only taken BP today. Denies N/v, SOB, or dizziness. FINDINGS: LUNGS AND PLEURA: No focal pulmonary opacity. No pulmonary edema. No pleural effusion. No pneumothorax. HEART AND MEDIASTINUM: No acute abnormality of the cardiac and mediastinal silhouettes. BONES AND  SOFT TISSUES: No acute osseous abnormality. IMPRESSION: 1. No acute process. Electronically signed by: Norman Gatlin MD 07/25/2024 07:20 PM EDT RP Workstation: [id number]     Procedures   Medications Ordered in the ED  sodium chloride  0.9 % bolus 500 mL (0 mLs Intravenous Stopped 07/25/24 2109)  pantoprazole  (PROTONIX ) injection 40 mg (40 mg Intravenous Given 07/25/24 2039)  nitroGLYCERIN  (NITROSTAT ) SL tablet 0.4 mg (0.4 mg Sublingual Given 07/25/24 2040)     Patient with atypical chest pain.  EKG unremarkable and troponins stable.                               Medical Decision Making Amount and/or Complexity of Data Reviewed Labs: ordered. Radiology: ordered.  Risk Prescription drug management.   Patient with atypical chest pain that has resolved.  She will follow-up with her family doctor and is referred to cardiology for possible further workup     Final diagnoses:  Atypical chest pain  LVH (left ventricular hypertrophy)    ED Discharge Orders          Ordered    Ambulatory referral to Cardiology       Comments: If you have not heard from the Cardiology office within the next 72 hours please call [phone].   07/25/24 2303                Suzette Pac, MD 07/27/24 1045

## 2024-08-01 DIAGNOSIS — R7309 Other abnormal glucose: Secondary | ICD-10-CM | POA: Diagnosis not present

## 2024-08-01 DIAGNOSIS — Z111 Encounter for screening for respiratory tuberculosis: Secondary | ICD-10-CM | POA: Diagnosis not present

## 2024-08-01 DIAGNOSIS — I10 Essential (primary) hypertension: Secondary | ICD-10-CM | POA: Diagnosis not present

## 2024-08-01 DIAGNOSIS — J45909 Unspecified asthma, uncomplicated: Secondary | ICD-10-CM | POA: Diagnosis not present

## 2024-08-05 ENCOUNTER — Ambulatory Visit: Payer: Self-pay

## 2024-08-07 ENCOUNTER — Ambulatory Visit: Payer: Self-pay

## 2024-08-12 ENCOUNTER — Encounter: Payer: Self-pay | Admitting: Nurse Practitioner

## 2024-08-16 ENCOUNTER — Ambulatory Visit: Admitting: Nurse Practitioner

## 2024-10-07 DIAGNOSIS — Z720 Tobacco use: Secondary | ICD-10-CM | POA: Diagnosis not present

## 2024-10-07 DIAGNOSIS — R9431 Abnormal electrocardiogram [ECG] [EKG]: Secondary | ICD-10-CM | POA: Diagnosis not present

## 2024-10-07 DIAGNOSIS — I10 Essential (primary) hypertension: Secondary | ICD-10-CM | POA: Diagnosis not present

## 2024-10-07 DIAGNOSIS — E785 Hyperlipidemia, unspecified: Secondary | ICD-10-CM | POA: Diagnosis not present
